# Patient Record
Sex: Female | Born: 2000 | Hispanic: Yes | Smoking: Never smoker
Health system: Southern US, Community
[De-identification: ages and names within clinical notes are randomized; demographics above are authoritative.]

## PROBLEM LIST (undated history)

## (undated) DIAGNOSIS — E8881 Metabolic syndrome: Secondary | ICD-10-CM

## (undated) DIAGNOSIS — J45909 Unspecified asthma, uncomplicated: Secondary | ICD-10-CM

## (undated) DIAGNOSIS — I10 Essential (primary) hypertension: Secondary | ICD-10-CM

## (undated) HISTORY — DX: Unspecified asthma, uncomplicated: J45.909

## (undated) HISTORY — PX: NO PAST SURGERIES: SHX2092

## (undated) HISTORY — DX: Metabolic syndrome: E88.81

---
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
## 2018-02-08 ENCOUNTER — Ambulatory Visit: Payer: Medicaid Other | Admitting: Allergy and Immunology

## 2018-02-09 ENCOUNTER — Ambulatory Visit (INDEPENDENT_AMBULATORY_CARE_PROVIDER_SITE_OTHER): Payer: Medicaid Other | Admitting: Allergy and Immunology

## 2018-02-09 ENCOUNTER — Encounter: Payer: Self-pay | Admitting: Allergy and Immunology

## 2018-02-09 VITALS — BP 104/70 | HR 88 | Temp 98.2°F | Resp 16

## 2018-02-09 DIAGNOSIS — J3089 Other allergic rhinitis: Secondary | ICD-10-CM | POA: Diagnosis not present

## 2018-02-09 DIAGNOSIS — J45901 Unspecified asthma with (acute) exacerbation: Secondary | ICD-10-CM

## 2018-02-09 MED ORDER — FLUTICASONE PROPIONATE 50 MCG/ACT NA SUSP: 2.0000 | Freq: Every day | NASAL | 5 refills | Status: DC | PRN

## 2018-02-09 MED ORDER — TIOTROPIUM BROMIDE MONOHYDRATE 1.25 MCG/ACT IN AERS: 2.0000 | INHALATION_SPRAY | Freq: Every day | RESPIRATORY_TRACT | 5 refills | Status: DC

## 2018-02-09 MED ORDER — ALBUTEROL SULFATE (2.5 MG/3ML) 0.083% IN NEBU: 2.5000 mg | INHALATION_SOLUTION | Freq: Four times a day (QID) | RESPIRATORY_TRACT | 1 refills | Status: DC | PRN

## 2018-02-09 NOTE — Assessment & Plan Note (Signed)
   Laboratory reform has been provided for serum IgE level and CBC with differential to assess candidacy for biologic agents.  Prednisone has been provided, 40 mg x3 days, 20 mg x1 day, 10 mg x1 day, then stop.  A prescription has been provided for Spiriva Respimat 1.25 g, 2 inhalations daily.  For now, continue Symbicort 160-4.5 g, 2 inhalations via spacer device twice daily, montelukast 10 mg daily bedtime, and albuterol every 6 hours as needed.  The patient has been asked to contact me if her symptoms persist or progress. Otherwise, she may return for follow up in 4 months.

## 2018-02-09 NOTE — Patient Instructions (Addendum)
Asthma with acute exacerbation  Laboratory reform has been provided for serum IgE level and CBC with differential to assess candidacy for biologic agents.  Prednisone has been provided, 40 mg x3 days, 20 mg x1 day, 10 mg x1 day, then stop.  A prescription has been provided for Spiriva Respimat 1.25 g, 2 inhalations daily.  For now, continue Symbicort 160-4.5 g, 2 inhalations via spacer device twice daily, montelukast 10 mg daily bedtime, and albuterol every 6 hours as needed.  The patient has been asked to contact me if her symptoms persist or progress. Otherwise, she may return for follow up in 4 months.  Allergic rhinitis  Continue appropriate allergen avoidance measures.  A prescription has been provided for fluticasone nasal spray, 1-2 sprays per nostril daily as needed.   I have also recommended nasal saline spray (i.e., Simply Saline) or nasal saline lavage (i.e., NeilMed) as needed and prior to medicated nasal sprays.  Continue azelastine nasal spray, 1-2 sprays per nostril twice daily.  For thick post nasal drainage, nasal congestion, and/or sinus pressure, add guaifenesin [phone] mg (Mucinex) plus/minus pseudoephedrine 60-120 mg  twice daily as needed with adequate hydration as discussed. Pseudoephedrine is only to be used for short-term relief of nasal/sinus congestion. Long-term use is discouraged due to potential side effects.  If allergen avoidance measures and medications fail to adequately relieve symptoms, aeroallergen immunotherapy will be considered.   Return in about 4 months (around 06/09/2018), or if symptoms worsen or fail to improve.

## 2018-02-09 NOTE — Progress Notes (Signed)
Follow-up Note  RE: Peggy [name] MRN: [id number] DOB: Aug 01, 2001 Date of Office Visit: 02/09/2018  Primary care provider: Melanie Crazier, NP Referring provider: Melanie Crazier, NP  History of present illness: Peggy [name] is a 17 y.o. female with persistent asthma and allergic rhinitis presenting today for sick visit.  She was last seen in this clinic in October 2018.  She reports that over the past month she has been "wheezing a lot", particularly at nighttime, as well as experiencing increased chest tightness and coughing.  She has been experiencing lower respiratory symptoms multiple times per day and is experiencing nocturnal awakenings due to lower respiratory symptoms 2 or 3 nights per week on average.  She also complains of nasal congestion, particularly at nighttime, which oftentimes results in mouth breathing.  She is attempting to control the nasal congestion with Vicks vapor rub.  In addition, she has been experiencing sinus pressure over the forehead.  She denies fevers, chills, and discolored mucus production.  She notes that spring pollen season is typically the worst season for her allergies and asthma.   Assessment and plan: Asthma with acute exacerbation  Laboratory reform has been provided for serum IgE level and CBC with differential to assess candidacy for biologic agents.  Prednisone has been provided, 40 mg x3 days, 20 mg x1 day, 10 mg x1 day, then stop.  A prescription has been provided for Spiriva Respimat 1.25 g, 2 inhalations daily.  For now, continue Symbicort 160-4.5 g, 2 inhalations via spacer device twice daily, montelukast 10 mg daily bedtime, and albuterol every 6 hours as needed.  The patient has been asked to contact me if her symptoms persist or progress. Otherwise, she may return for follow up in 4 months.  Allergic rhinitis  Continue appropriate allergen avoidance measures.  A prescription has been provided  for fluticasone nasal spray, 1-2 sprays per nostril daily as needed.   I have also recommended nasal saline spray (i.e., Simply Saline) or nasal saline lavage (i.e., NeilMed) as needed and prior to medicated nasal sprays.  Continue azelastine nasal spray, 1-2 sprays per nostril twice daily.  For thick post nasal drainage, nasal congestion, and/or sinus pressure, add guaifenesin [phone] mg (Mucinex) plus/minus pseudoephedrine 60-120 mg  twice daily as needed with adequate hydration as discussed. Pseudoephedrine is only to be used for short-term relief of nasal/sinus congestion. Long-term use is discouraged due to potential side effects.  If allergen avoidance measures and medications fail to adequately relieve symptoms, aeroallergen immunotherapy will be considered.   Meds ordered this encounter  Medications  . Tiotropium Bromide Monohydrate (SPIRIVA RESPIMAT) 1.25 MCG/ACT AERS    Sig: Inhale 2 puffs into the lungs daily.    Dispense:  4 g    Refill:  5  . fluticasone (FLONASE) 50 MCG/ACT nasal spray    Sig: Place 2 sprays into both nostrils daily as needed for allergies or rhinitis.    Dispense:  18.2 g    Refill:  5  . albuterol (PROVENTIL) (2.5 MG/3ML) 0.083% nebulizer solution    Sig: Take 3 mLs (2.5 mg total) by nebulization every 6 (six) hours as needed for wheezing or shortness of breath.    Dispense:  75 mL    Refill:  1    Diagnostics: Spirometry reveals an FVC of 2.73 L and an FEV1 of 2.49 L (83% predicted) with 70 mL (3%) postbronchodilator improvement.  Please see scanned spirometry results for details.    Physical examination: Blood pressure  104/70, pulse 88, temperature 98.2 F (36.8 C), temperature source Oral, resp. rate 16, SpO2 96 %.  General: Alert, interactive, in no acute distress. HEENT: TMs pearly gray, turbinates moderately edematous without discharge, post-pharynx mildly erythematous. Neck: Supple without lymphadenopathy. Lungs: Clear to auscultation  without wheezing, rhonchi or rales. CV: Normal S1, S2 without murmurs. Skin: Warm and dry, without lesions or rashes.  The following portions of the patient's history were reviewed and updated as appropriate: allergies, current medications, past family history, past medical history, past social history, past surgical history and problem list.  Allergies as of 02/09/2018   No Known Allergies     Medication List        Accurate as of 02/09/18  1:43 PM. Always use your most recent med list.          albuterol 108 (90 Base) MCG/ACT inhaler Commonly known as:  PROAIR HFA Inhale two puffs every four hours as needed for cough or wheeze.  Can use 10 minutes prior to exercise.   albuterol (2.5 MG/3ML) 0.083% nebulizer solution Commonly known as:  PROVENTIL Take 3 mLs (2.5 mg total) by nebulization every 6 (six) hours as needed for wheezing or shortness of breath.   Azelastine HCl 0.15 % Soln Place 2 sprays into the nose 2 (two) times daily as needed.   budesonide-formoterol 160-4.5 MCG/ACT inhaler Commonly known as:  SYMBICORT Inhale 2 puffs into the lungs 2 (two) times daily.   cetirizine 10 MG tablet Commonly known as:  ZYRTEC Take 1 tablet once a day   fluticasone 50 MCG/ACT nasal spray Commonly known as:  FLONASE Place 2 sprays into both nostrils daily as needed for allergies or rhinitis.   ibuprofen 600 MG tablet Commonly known as:  ADVIL,MOTRIN Reported on 06/16/2016   metFORMIN 500 MG tablet Commonly known as:  GLUCOPHAGE Take 1 tablet (500 mg total) by mouth 2 (two) times daily with a meal.   montelukast 10 MG tablet Commonly known as:  SINGULAIR Take 1 tablet (10 mg total) by mouth at bedtime.   naproxen 500 MG tablet Commonly known as:  NAPROSYN Reported on 06/16/2016   Tiotropium Bromide Monohydrate 1.25 MCG/ACT Aers Commonly known as:  SPIRIVA RESPIMAT Inhale 2 puffs into the lungs daily.       No Known Allergies  Review of systems: Review of systems  negative except as noted in HPI / PMHx or noted below: Constitutional: Negative.  HENT: Negative.   Eyes: Negative.  Respiratory: Negative.   Cardiovascular: Negative.  Gastrointestinal: Negative.  Genitourinary: Negative.  Musculoskeletal: Negative.  Neurological: Negative.  Endo/Heme/Allergies: Negative.  Cutaneous: Negative.  Past Medical History:  Diagnosis Date  . Asthma     Family History  Problem Relation Age of Onset  . Hypertension Mother   . Hypertension Maternal Grandmother   . Allergic rhinitis Neg Hx   . Angioedema Neg Hx   . Asthma Neg Hx   . Eczema Neg Hx   . Immunodeficiency Neg Hx   . Urticaria Neg Hx     Social History   Socioeconomic History  . Marital status: Single    Spouse name: Not on file  . Number of children: Not on file  . Years of education: Not on file  . Highest education level: Not on file  Social Needs  . Financial resource strain: Not on file  . Food insecurity - worry: Not on file  . Food insecurity - inability: Not on file  . Transportation needs - medical: Not  on file  . Transportation needs - non-medical: Not on file  Occupational History  . Not on file  Tobacco Use  . Smoking status: Never Smoker  . Smokeless tobacco: Never Used  Substance and Sexual Activity  . Alcohol use: No    Alcohol/week: 0.0 oz  . Drug use: No  . Sexual activity: Not on file  Other Topics Concern  . Not on file  Social History Narrative   Lives at home with mom dad, brother and sister attends [employer] is in the 11th grade.     I appreciate the opportunity to take part in Bartholome BillMaria [name]'s care. Please do not hesitate to contact me with questions.  Sincerely,   R. Jorene Guestarter Seven Dollens, MD

## 2018-02-09 NOTE — Assessment & Plan Note (Signed)
   Continue appropriate allergen avoidance measures.  A prescription has been provided for fluticasone nasal spray, 1-2 sprays per nostril daily as needed.   I have also recommended nasal saline spray (i.e., Simply Saline) or nasal saline lavage (i.e., NeilMed) as needed and prior to medicated nasal sprays.  Continue azelastine nasal spray, 1-2 sprays per nostril twice daily.  For thick post nasal drainage, nasal congestion, and/or sinus pressure, add guaifenesin [phone] mg (Mucinex) plus/minus pseudoephedrine 60-120 mg  twice daily as needed with adequate hydration as discussed. Pseudoephedrine is only to be used for short-term relief of nasal/sinus congestion. Long-term use is discouraged due to potential side effects.  If allergen avoidance measures and medications fail to adequately relieve symptoms, aeroallergen immunotherapy will be considered.

## 2018-02-12 LAB — CBC WITH DIFFERENTIAL/PLATELET
Basophils Absolute: 0 10*3/uL (ref 0.0–0.2)
Basos: 0 %
EOS (ABSOLUTE): 0 10*3/uL (ref 0.0–0.4)
Eos: 1 %
Hematocrit: 41.2 % (ref 34.0–46.6)
Hemoglobin: 13.5 g/dL (ref 11.1–15.9)
Immature Grans (Abs): 0 10*3/uL (ref 0.0–0.1)
Immature Granulocytes: 0 %
Lymphocytes Absolute: 2.1 10*3/uL (ref 0.7–3.1)
Lymphs: 36 %
MCH: 28.1 pg (ref 26.6–33.0)
MCHC: 32.8 g/dL (ref 31.5–35.7)
MCV: 86 fL (ref 79–97)
Monocytes Absolute: 0.3 10*3/uL (ref 0.1–0.9)
Monocytes: 5 %
Neutrophils Absolute: 3.4 10*3/uL (ref 1.4–7.0)
Neutrophils: 58 %
Platelets: 266 10*3/uL (ref 150–379)
RBC: 4.8 x10E6/uL (ref 3.77–5.28)
RDW: 14 % (ref 12.3–15.4)
WBC: 5.8 10*3/uL (ref 3.4–10.8)

## 2018-02-12 LAB — IGE: IgE (Immunoglobulin E), Serum: 27 IU/mL (ref 0–100)

## 2018-02-17 ENCOUNTER — Other Ambulatory Visit: Payer: Self-pay | Admitting: *Deleted

## 2018-02-17 DIAGNOSIS — J4540 Moderate persistent asthma, uncomplicated: Secondary | ICD-10-CM

## 2018-02-23 ENCOUNTER — Ambulatory Visit: Payer: Medicaid Other | Admitting: Allergy and Immunology

## 2018-03-22 ENCOUNTER — Ambulatory Visit: Payer: Medicaid Other | Admitting: Allergy and Immunology

## 2018-04-09 ENCOUNTER — Other Ambulatory Visit: Payer: Self-pay | Admitting: Allergy and Immunology

## 2018-04-09 DIAGNOSIS — J45901 Unspecified asthma with (acute) exacerbation: Secondary | ICD-10-CM

## 2018-06-14 ENCOUNTER — Ambulatory Visit: Payer: Medicaid Other | Admitting: Allergy and Immunology

## 2018-11-05 ENCOUNTER — Encounter: Payer: Self-pay | Admitting: Internal Medicine

## 2018-11-05 ENCOUNTER — Ambulatory Visit: Payer: Self-pay | Admitting: Internal Medicine

## 2018-11-05 VITALS — BP 120/82 | HR 82 | Resp 12 | Ht 59.0 in | Wt 156.0 lb

## 2018-11-05 DIAGNOSIS — J45901 Unspecified asthma with (acute) exacerbation: Secondary | ICD-10-CM

## 2018-11-05 DIAGNOSIS — Z30011 Encounter for initial prescription of contraceptive pills: Secondary | ICD-10-CM

## 2018-11-05 DIAGNOSIS — Z7251 High risk heterosexual behavior: Secondary | ICD-10-CM

## 2018-11-05 DIAGNOSIS — J3089 Other allergic rhinitis: Secondary | ICD-10-CM

## 2018-11-05 DIAGNOSIS — R300 Dysuria: Secondary | ICD-10-CM

## 2018-11-05 DIAGNOSIS — N760 Acute vaginitis: Secondary | ICD-10-CM

## 2018-11-05 LAB — POCT WET PREP WITH KOH
KOH Prep POC: NEGATIVE
RBC Wet Prep HPF POC: NEGATIVE
TRICHOMONAS UA: NEGATIVE
Yeast Wet Prep HPF POC: NEGATIVE

## 2018-11-05 LAB — POCT URINALYSIS DIPSTICK
BILIRUBIN UA: NEGATIVE
Blood, UA: NEGATIVE
GLUCOSE UA: NEGATIVE
KETONES UA: NEGATIVE
Nitrite, UA: POSITIVE
PROTEIN UA: NEGATIVE
UROBILINOGEN UA: 0.2 U/dL
pH, UA: 6.5 (ref 5.0–8.0)

## 2018-11-05 LAB — POCT URINE PREGNANCY: Preg Test, Ur: NEGATIVE

## 2018-11-05 MED ORDER — METRONIDAZOLE 500 MG PO TABS: ORAL_TABLET | ORAL | 0 refills | Status: DC

## 2018-11-05 MED ORDER — MONTELUKAST SODIUM 10 MG PO TABS: 10.0000 mg | ORAL_TABLET | Freq: Every day | ORAL | 11 refills | Status: DC

## 2018-11-05 MED ORDER — ALBUTEROL SULFATE HFA 108 (90 BASE) MCG/ACT IN AERS: INHALATION_SPRAY | RESPIRATORY_TRACT | 1 refills | Status: DC

## 2018-11-05 MED ORDER — FLUTICASONE PROPIONATE 50 MCG/ACT NA SUSP: 2.0000 | Freq: Every day | NASAL | 11 refills | Status: DC | PRN

## 2018-11-05 MED ORDER — CETIRIZINE HCL 10 MG PO TABS: ORAL_TABLET | ORAL | 11 refills | Status: DC

## 2018-11-05 MED ORDER — NORGESTIMATE-ETH ESTRADIOL 0.25-35 MG-MCG PO TABS: 1.0000 | ORAL_TABLET | Freq: Every day | ORAL | 3 refills | Status: DC

## 2018-11-05 MED ORDER — BUDESONIDE-FORMOTEROL FUMARATE 160-4.5 MCG/ACT IN AERO: 2.0000 | INHALATION_SPRAY | Freq: Two times a day (BID) | RESPIRATORY_TRACT | 11 refills | Status: DC

## 2018-11-05 NOTE — Patient Instructions (Signed)
Can google "advance directives, [place]"  And bring up form from Secretary of State. Print and fill out Or can go to "5 wishes"  Which is also in Spanish and fill out--this costs $5--perhaps easier to use. Designate a Medical Power of Attorney to speak for you if you are unable to speak for yourself when ill or injured  

## 2018-11-05 NOTE — Progress Notes (Addendum)
   Subjective:    Patient ID: Peggy [name], female    DOB: 11-16-2001, 17 y.o.   MRN: [id number]  HPI   Here to establish  1.  Had unprotected sex with a boyfriend she knows well about 1 week ago and then 3 days ago, she developed really obvious yellow discharge and odor.  She states she did have mild discharge and odor actually for past 2-3 weeks.  Fishy odor. No pelvic or vaginal pain.  No itching.  Never with an STD before. She has not had a period since the unprotected sex. Did try Azo as she is having burning on urination --states the burning is on the outside.   No other otc meds tried.  2.  [facility] Does not smoke. No family history of clotting disorder/DVT/PE.  3.  Asthma and allergies:  Not taking her meds as cannot afford.  No outpatient medications have been marked as taking for the 11/05/18 encounter (Office Visit) with Julieanne MansonMulberry, Teondra Newburg, MD.    No Known Allergies  Review of Systems     Objective:   Physical Exam NAD Lungs:  CTA CV:  RRR with normal S1 and S2, No S3, S4 or murmur.  Radial pulses normal and equal Abd:  S, NT, No HSM or mass, + BS GU:  Normal external genitalia with white vaginal discharge and underlying mild vaginal inflammation.  Cervix without lesion.  No CMT.  No uterine or adnexal mass or tenderness.      Assessment & Plan:  1.  Acute Vaginitis:  Wet prep supports BV.   Sending GC/chlamydia, RPR, HIV. Metronidazole 500 mg twice daily for 7 days.  Discussed side effects, no alcohol  2.  Dysuria with + nitrite and trace leuk.  Not clear she has a UTI.  Check urine culture.  3.  Unprotected sex:  Urine HCG negative.    4.  Birth control:  Sprintec 28 day, to start the Sunday after next period end of this month.  To use condoms with intercourse until then and for first month of use.  5.  Ast[facility] and allergies:  [facility]  CPE with pap in 3 months.

## 2018-11-06 LAB — HIV ANTIBODY (ROUTINE TESTING W REFLEX): HIV Screen 4th Generation wRfx: NONREACTIVE

## 2018-11-06 LAB — RPR QUALITATIVE: RPR: NONREACTIVE

## 2018-11-07 LAB — URINE CULTURE

## 2018-11-07 MED ORDER — SULFAMETHOXAZOLE-TRIMETHOPRIM 800-160 MG PO TABS: 1.0000 | ORAL_TABLET | Freq: Two times a day (BID) | ORAL | 0 refills | Status: DC

## 2018-11-07 NOTE — Addendum Note (Signed)
Addended by: Marcene DuosMULBERRY, Artha Chiasson M on: 11/07/2018 07:17 PM   Modules accepted: Orders

## 2018-11-09 LAB — GC/CHLAMYDIA PROBE AMP
Chlamydia trachomatis, NAA: POSITIVE — AB
Neisseria gonorrhoeae by PCR: NEGATIVE

## 2018-11-17 ENCOUNTER — Telehealth: Payer: Self-pay | Admitting: Internal Medicine

## 2018-11-17 MED ORDER — AZITHROMYCIN 500 MG PO TABS: ORAL_TABLET | ORAL | 0 refills | Status: DC

## 2018-11-17 NOTE — Telephone Encounter (Signed)
Patient called stating 20 minutes after swallow azithromycin (ZITHROMAX) 500 MG tablet threw up the pill. Patient wanted advise from Dr. Delrae AlfredMulberry on what is next. Information shared with Dr. Delrae AlfredMulberry who [facility] to check if patient can go back to pick another dose of the prescription; pharmacist stated medication can be dispense but this time patient has to pay $6.00 dollars to get it. Dr. Delrae AlfredMulberry spoke with patient and provided information on new prescription Docycycline 100 mg twice daily x 7 days. Patient agreed and stated will start new medication today.

## 2018-11-17 NOTE — Addendum Note (Signed)
Addended by: Marcene DuosMULBERRY, Oz Gammel M on: 11/17/2018 10:09 AM   Modules accepted: Orders

## 2018-11-29 ENCOUNTER — Other Ambulatory Visit: Payer: Self-pay

## 2018-11-29 ENCOUNTER — Telehealth: Payer: Self-pay | Admitting: Internal Medicine

## 2018-11-29 MED ORDER — METRONIDAZOLE 500 MG PO TABS: ORAL_TABLET | ORAL | 0 refills | Status: DC

## 2018-11-29 NOTE — Telephone Encounter (Signed)
Spoke with patient. States all symptoms are the same except for the odor has gone away. Patient states she has finishes the doxycycline and now she has been having lower abdominal cramping. Patient leaving the country and wants to know what she should since she is not feeling better.  Per Dr. Delrae AlfredMulberry have patient start back on metronidazole for 1 twice a day for 7 days. Patient needs to take medication with a fatty meal or with milk. Called in 4 more pills to PHD to give her a 7 day dose being patient still had 10 pills left from before. Patient verbalized understanding and Rx called into pharmacy.

## 2018-11-29 NOTE — Telephone Encounter (Signed)
Patient would like to know if she is suppose to see different result by end of the week.  She has not seen a change since taking the Rx.  Patient is leaving the country on Saturday and should she start the Rx over.  Rx in question is sulfamethoxazole-trimethoprim (BACTRIM DS.SEPTRA DS) 800-160 MG tablet.  Patient can be reached [phone].

## 2019-02-07 ENCOUNTER — Ambulatory Visit: Payer: Medicaid Other | Admitting: Internal Medicine

## 2019-02-07 ENCOUNTER — Encounter: Payer: Self-pay | Admitting: Internal Medicine

## 2019-02-07 VITALS — BP 122/82 | HR 84 | Resp 12 | Ht 59.0 in | Wt 146.0 lb

## 2019-02-07 DIAGNOSIS — Z0000 Encounter for general adult medical examination without abnormal findings: Secondary | ICD-10-CM | POA: Diagnosis not present

## 2019-02-07 DIAGNOSIS — Z9149 Other personal history of psychological trauma, not elsewhere classified: Secondary | ICD-10-CM

## 2019-02-07 DIAGNOSIS — Z9189 Other specified personal risk factors, not elsewhere classified: Secondary | ICD-10-CM

## 2019-02-07 DIAGNOSIS — E663 Overweight: Secondary | ICD-10-CM

## 2019-02-07 DIAGNOSIS — B360 Pityriasis versicolor: Secondary | ICD-10-CM

## 2019-02-07 DIAGNOSIS — Z8659 Personal history of other mental and behavioral disorders: Secondary | ICD-10-CM | POA: Diagnosis not present

## 2019-02-07 DIAGNOSIS — J45909 Unspecified asthma, uncomplicated: Secondary | ICD-10-CM

## 2019-02-07 DIAGNOSIS — J3089 Other allergic rhinitis: Secondary | ICD-10-CM

## 2019-02-07 MED ORDER — NORGESTIMATE-ETH ESTRADIOL 0.25-35 MG-MCG PO TABS: 1.0000 | ORAL_TABLET | Freq: Every day | ORAL | 3 refills | Status: DC

## 2019-02-07 NOTE — Patient Instructions (Signed)

## 2019-02-07 NOTE — Progress Notes (Signed)
Subjective:    Patient ID: Peggy [name], female   DOB: 16-Jan-2001, 18 y.o.   MRN: [id number]   HPI   CPE without pap  1.  Pap:  No pap previously.  Just started with heavy menstruation today.  History of HPV vaccination completed in past.  No family history of cervical cancer.  2.  Mammogram:  Never.  No biologic family with history of breast cancer.   3.  Osteoprevention:  Not much in way of dairy daily.  Not very physically active.  4.  Guaiac Cards:  Never.    5.  Colonoscopy:  Never.  No family history of colon cancer  6.  Immunizations:  Did not get influenza vaccine this year.  7.  Glucose/Cholesterol:  Has had mildly elevated glucose in the past with increased weight, but A1C never higher than 5.5% No lipids I can find in chart.  Asthma and allergies:  Not having any symptoms, so not taking any meds.  She is using albuterol in HFA or nebulized form twice weekly.  She is not sure of what meds she is to use for what.   Only filled her [facility] for 1 month and left for trip out of the country.  Did not refill since.  Current Meds  Medication Sig  . albuterol (PROAIR HFA) 108 (90 Base) MCG/ACT inhaler Inhale two puffs every four hours as needed  . budesonide-formoterol (SYMBICORT) 160-4.5 MCG/ACT inhaler Inhale 2 puffs into the lungs 2 (two) times daily.   No Known Allergies  Past Medical History:  Diagnosis Date  . Asthma     Past Surgical History:  Procedure Laterality Date  . NO PAST SURGERIES      Family History  Problem Relation Age of Onset  . Hypertension Mother   . Hypertension Maternal Grandmother   . Allergic rhinitis Neg Hx   . Angioedema Neg Hx   . Asthma Neg Hx   . Eczema Neg Hx   . Immunodeficiency Neg Hx   . Urticaria Neg Hx    Social History   Socioeconomic History  . Marital status: Single    Spouse name: Not on file  . Number of children: 0  . Years of education: 33  . Highest education level: High school graduate   Occupational History  . Occupation: student/[employer]  Social Needs  . Financial resource strain: Not on file  . Food insecurity:    Worry: Not on file    Inability: Not on file  . Transportation needs:    Medical: Not on file    Non-medical: Not on file  Tobacco Use  . Smoking status: Never Smoker  . Smokeless tobacco: Never Used  Substance and Sexual Activity  . Alcohol use: Yes    Alcohol/week: 0.0 standard drinks    Comment: occasionally  . Drug use: No  . Sexual activity: Yes    Birth control/protection: Condom  Lifestyle  . Physical activity:    Days per week: Not on file    Minutes per session: Not on file  . Stress: Not on file  Relationships  . Social connections:    Talks on phone: Not on file    Gets together: Not on file    Attends religious service: Not on file    Active member of club or organization: Not on file    Attends meetings of clubs or organizations: Not on file    Relationship status: Not on file  . Intimate partner violence:  Fear of current or ex partner: Not on file    Emotionally abused: Not on file    Physically abused: Not on file    Forced sexual activity: Not on file  Other Topics Concern  . Not on file  Social History Narrative   Lives at home with parents and 3 of her younger siblings    Review of Systems  Respiratory:       Chest tightness twice weekly when physically active.  Gastrointestinal: Positive for constipation (bloated.  Has hard stool and then blood on the tissue when passes at times.  ).  Psychiatric/Behavioral:       Shared that when she was 43 yo, her friend committed suicide in front of her in front of a locked glass door, so she could not intervene.   Last month, her cousin and young godfather were killed in an MVA.  She states she has received counseling in past and feels she is doing okay.   Has learned better coping mechanisms. Describes also purging when she was age 18-12 yo.  States she was teased by  her sisters for being overweight.  She never received counseling and has not purged for years.      Objective:   BP 122/82 (BP Location: Left Arm, Patient Position: Sitting, Cuff Size: Normal)   Pulse 84   Resp 12   Ht 4\' 11"  (1.499 m)   Wt 146 lb (66.2 kg)   LMP 02/06/2019   BMI 29.49 kg/m   Physical Exam  Constitutional: She is oriented to person, place, and time. She appears well-developed and well-nourished.  HENT:  Head: Normocephalic and atraumatic.  Right Ear: Hearing, tympanic membrane, external ear and ear canal normal.  Left Ear: Hearing, tympanic membrane, external ear and ear canal normal.  Nose: Nose normal.  Mouth/Throat: Uvula is midline, oropharynx is clear and moist and mucous membranes are normal.  Eyes: Pupils are equal, round, and reactive to light. Conjunctivae and EOM are normal.  Discs sharp bilaterally.  Neck: Normal range of motion and full passive range of motion without pain. Neck supple. No thyromegaly present.  Cardiovascular: Normal rate, regular rhythm, S1 normal and S2 normal. Exam reveals no S3, no S4 and no friction rub.  No murmur heard. Carotid, radial, femoral, DP and PT pulses normal and equal  Pulmonary/Chest: Effort normal and breath sounds normal. Right breast exhibits no inverted nipple, no mass, no nipple discharge and no skin change. Left breast exhibits no inverted nipple, no mass, no nipple discharge and no skin change.  Bilateral nipple piercings.  Abdominal: Soft. Normal appearance and bowel sounds are normal. She exhibits no mass. There is no hepatosplenomegaly. There is no abdominal tenderness. No hernia.  Umbilical piercing.  Genitourinary:    Genitourinary Comments: No examination today due to heavy menstrual flow and patient preference.   Musculoskeletal: Normal range of motion.  Lymphadenopathy:       Head (right side): No submental and no submandibular adenopathy present.       Head (left side): No submental and no  submandibular adenopathy present.    She has no cervical adenopathy.    She has no axillary adenopathy.       Right: No inguinal and no supraclavicular adenopathy present.       Left: No inguinal and no supraclavicular adenopathy present.  Neurological: She is alert and oriented to person, place, and time. She has normal strength and normal reflexes. No cranial nerve deficit or sensory deficit. Coordination  and gait normal.  Skin: Skin is warm.  Back with oval and round areas of varying size with loss of pigment.  Currently without any flaking.  Patient states she gets these more so during hotter months and they creep up her neck to hairline.  They can be on anterior chest as well.  Scattered tattoos.  Psychiatric: She has a normal mood and affect. Her speech is normal and behavior is normal. Judgment and thought content normal. Cognition and memory are normal.     Assessment & Plan   1.  CPE Return for fasting labs in 2 or 6 weeks and pelvic exam as well FLP, CMP, CBC  2.  Tinea versicolor:  Appears quiescent currently with post inflammatory hypopigmentation.  3.  Birth Control:  Refill BCPs  4.  Asthma /allergy:  To return as above to go over meds.   5.  Several traumatic experiences:  States she is doing fine now.  Will let us know if needs counseling.    6.  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

## 2019-03-01 ENCOUNTER — Ambulatory Visit: Payer: Medicaid Other | Admitting: Internal Medicine

## 2019-03-08 ENCOUNTER — Ambulatory Visit: Payer: Medicaid Other | Admitting: Internal Medicine

## 2019-04-07 ENCOUNTER — Encounter: Payer: Self-pay | Admitting: Internal Medicine

## 2019-04-07 ENCOUNTER — Other Ambulatory Visit: Payer: Self-pay

## 2019-04-07 ENCOUNTER — Ambulatory Visit: Payer: Medicaid Other | Admitting: Internal Medicine

## 2019-04-07 VITALS — BP 112/70 | HR 74 | Resp 12 | Ht 59.0 in | Wt 146.0 lb

## 2019-04-07 DIAGNOSIS — B9689 Other specified bacterial agents as the cause of diseases classified elsewhere: Secondary | ICD-10-CM

## 2019-04-07 DIAGNOSIS — E8881 Metabolic syndrome: Secondary | ICD-10-CM

## 2019-04-07 DIAGNOSIS — R350 Frequency of micturition: Secondary | ICD-10-CM

## 2019-04-07 DIAGNOSIS — N760 Acute vaginitis: Secondary | ICD-10-CM

## 2019-04-07 DIAGNOSIS — Z9149 Other personal history of psychological trauma, not elsewhere classified: Secondary | ICD-10-CM | POA: Insufficient documentation

## 2019-04-07 DIAGNOSIS — J45909 Unspecified asthma, uncomplicated: Secondary | ICD-10-CM

## 2019-04-07 DIAGNOSIS — J4540 Moderate persistent asthma, uncomplicated: Secondary | ICD-10-CM

## 2019-04-07 DIAGNOSIS — J45901 Unspecified asthma with (acute) exacerbation: Secondary | ICD-10-CM

## 2019-04-07 DIAGNOSIS — Z8659 Personal history of other mental and behavioral disorders: Secondary | ICD-10-CM | POA: Insufficient documentation

## 2019-04-07 DIAGNOSIS — J3089 Other allergic rhinitis: Secondary | ICD-10-CM

## 2019-04-07 LAB — POCT WET PREP WITH KOH
KOH Prep POC: NEGATIVE
RBC Wet Prep HPF POC: NEGATIVE
Trichomonas, UA: NEGATIVE
Yeast Wet Prep HPF POC: NEGATIVE

## 2019-04-07 LAB — POCT URINE PREGNANCY: Preg Test, Ur: NEGATIVE

## 2019-04-07 MED ORDER — ALBUTEROL SULFATE HFA 108 (90 BASE) MCG/ACT IN AERS: INHALATION_SPRAY | RESPIRATORY_TRACT | 1 refills | Status: DC

## 2019-04-07 MED ORDER — BUDESONIDE-FORMOTEROL FUMARATE 160-4.5 MCG/ACT IN AERO: 2.0000 | INHALATION_SPRAY | Freq: Two times a day (BID) | RESPIRATORY_TRACT | 11 refills | Status: DC

## 2019-04-07 MED ORDER — CEFTRIAXONE SODIUM 250 MG IJ SOLR
250.0000 mg | Freq: Once | INTRAMUSCULAR | Status: AC
  Administered 2019-04-07: 250 mg via INTRAMUSCULAR

## 2019-04-07 MED ORDER — CETIRIZINE HCL 10 MG PO TABS: ORAL_TABLET | ORAL | 11 refills | Status: DC

## 2019-04-07 MED ORDER — METRONIDAZOLE 500 MG PO TABS: ORAL_TABLET | ORAL | 0 refills | Status: DC

## 2019-04-07 MED ORDER — DOXYCYCLINE HYCLATE 100 MG PO TABS: ORAL_TABLET | ORAL | 0 refills | Status: DC

## 2019-04-07 NOTE — Progress Notes (Signed)
Subjective:    Patient ID: Peggy [name], female   DOB: [birth year], 18 y.o.   MRN: [id number]   HPI   1.  Here to finish up on pelvic/CPE from last visit in February as well as for vaginal discharge.  2.  Vaginal discharge:  Yellow green with an odor.  Sbe is not having anything but occasional pelvic cramping, but no pain.   Had intercourse with boyfriend she was previously with when she developed a chlamydial vaginitis in November of 2019 this past  Saturday or 5 days ago.  The next day she developed cramping and then noted the discharge. Her boyfriend states he had not recently been sexually active with anyone else and he was not having any symptoms.  She is having urinary urgency and frequency starting 4 days ago.  Only small amount of urine each time.   No flank pain.  No fever.    She has been utilizing her BCPs regularly since her physical in February.  3.  Insulin resistance syndrome with ovulatory dysfunction:  She states she was told she had prediabetes, but looks like the feeling was she perhaps has PCOS as well.  She currently denies having problems with periods before, though oligomenorrhea is well documented in her chart.   Taking Metformin 500 mg twice daily  4.  Asthma and allergies:  States only has problems with asthma when she is not taking her Cetirizine during spring and fall pollen seasons.   She later states she also had problems with exercise. Not very physically active currently. Is using her corticosteroid/LABA inhaler only before exercise along with SABA. Discussed the former really is to be used regularly for benefit and the SABA only when needed.  Discussed loss of effectiveness if SABA if used regularly.   Current Meds  Medication Sig   albuterol (PROAIR HFA) 108 (90 Base) MCG/ACT inhaler Inhale two puffs every four hours as needed   budesonide-formoterol (SYMBICORT) 160-4.5 MCG/ACT inhaler Inhale 2 puffs into the lungs 2 (two)  times daily.   metFORMIN (GLUCOPHAGE) 500 MG tablet Take 1 tablet (500 mg total) by mouth 2 (two) times daily with a meal.   norgestimate-ethinyl estradiol (SPRINTEC 28) 0.25-35 MG-MCG tablet Take 1 tablet by mouth daily.   No Known Allergies   Review of Systems    Objective:   BP 112/70 (BP Location: Left Arm, Patient Position: Sitting, Cuff Size: Normal)    Pulse 74    Resp 12    Ht 4\' 11"  (1.499 m)    Wt 146 lb (66.2 kg)    LMP 03/11/2019    BMI 29.49 kg/m   Physical Exam  NAD Lungs:  CTA CV:  RRR without murmur or rub.  Radial and DP pulses normal and equal Abd:  No flank tenderness.  No HSM or mass, Tender mildly without rebound or peritoneal signs in LLQ.  + BS GU:  Normal external genitalia.  Mild white/yellow discharge.   Perhaps mild underlying erythema of vaginal and cervical mucosa.  No lesions.  No CMT.  Tender without mass in left adnexa.  NT on palpation of uterus and right adnexa, also without mass.     Assessment & Plan   1.  Vaginitis/bacterial vaginosis:  GC/chlamydia, wet prep, the latter showed BV with strong whiff.   Ceftriaxone 150 mg IM Doxycycline 100 mg twice daily for 7 days. Metronidazole 500 mg twice daily for 7 days. To call if worsen. Encouraged her to rethink having  intercourse with this partner again. Encouraged her to stay at home/isolate with COVID-19  2.  Urinary frequency:  No evidence of UTI.  Likely related to #1.  3.  Asthma/allergies:  Rx for Cetirizine to [facility].   [facility] Symbicort [facility]. Encouraged her to get started on daily physical activity and use the Symbicort daily so she is not dyspneic. Albuterol to be used only as needed.  4.  Insulin resistance with history of acanthosis nigricans and oligomenorrhea: sounds like was felt to have PCOS:  Has refills of Metformin.  She will call when refill needed.

## 2019-04-09 LAB — GC/CHLAMYDIA PROBE AMP
Chlamydia trachomatis, NAA: NEGATIVE
Neisseria Gonorrhoeae by PCR: NEGATIVE

## 2019-04-13 ENCOUNTER — Ambulatory Visit: Payer: Medicaid Other | Admitting: Internal Medicine

## 2019-05-31 ENCOUNTER — Ambulatory Visit: Payer: Medicaid Other | Admitting: Internal Medicine

## 2019-07-08 ENCOUNTER — Ambulatory Visit: Payer: Self-pay | Admitting: Internal Medicine

## 2020-02-07 ENCOUNTER — Other Ambulatory Visit: Payer: Self-pay

## 2020-02-07 ENCOUNTER — Inpatient Hospital Stay (HOSPITAL_COMMUNITY): Payer: PRIVATE HEALTH INSURANCE

## 2020-02-07 ENCOUNTER — Inpatient Hospital Stay (HOSPITAL_COMMUNITY)
Admission: AD | Admit: 2020-02-07 | Discharge: 2020-02-07 | Disposition: A | Discharge status: Home / Self Care | Payer: PRIVATE HEALTH INSURANCE | Attending: Obstetrics and Gynecology | Admitting: Obstetrics and Gynecology

## 2020-02-07 ENCOUNTER — Encounter: Payer: Self-pay | Admitting: Student

## 2020-02-07 DIAGNOSIS — Z3A01 Less than 8 weeks gestation of pregnancy: Secondary | ICD-10-CM | POA: Insufficient documentation

## 2020-02-07 DIAGNOSIS — Z7984 Long term (current) use of oral hypoglycemic drugs: Secondary | ICD-10-CM | POA: Insufficient documentation

## 2020-02-07 DIAGNOSIS — O26891 Other specified pregnancy related conditions, first trimester: Secondary | ICD-10-CM

## 2020-02-07 DIAGNOSIS — E8881 Metabolic syndrome: Secondary | ICD-10-CM | POA: Diagnosis not present

## 2020-02-07 DIAGNOSIS — O200 Threatened abortion: Secondary | ICD-10-CM | POA: Insufficient documentation

## 2020-02-07 DIAGNOSIS — J45909 Unspecified asthma, uncomplicated: Secondary | ICD-10-CM | POA: Insufficient documentation

## 2020-02-07 DIAGNOSIS — Z79899 Other long term (current) drug therapy: Secondary | ICD-10-CM | POA: Insufficient documentation

## 2020-02-07 DIAGNOSIS — O209 Hemorrhage in early pregnancy, unspecified: Secondary | ICD-10-CM

## 2020-02-07 DIAGNOSIS — O3680X0 Pregnancy with inconclusive fetal viability, not applicable or unspecified: Secondary | ICD-10-CM | POA: Diagnosis not present

## 2020-02-07 DIAGNOSIS — O99511 Diseases of the respiratory system complicating pregnancy, first trimester: Secondary | ICD-10-CM | POA: Insufficient documentation

## 2020-02-07 LAB — URINALYSIS, ROUTINE W REFLEX MICROSCOPIC
Bacteria, UA: NONE SEEN
Bilirubin Urine: NEGATIVE
Glucose, UA: NEGATIVE mg/dL
Ketones, ur: NEGATIVE mg/dL
Leukocytes,Ua: NEGATIVE
Nitrite: NEGATIVE
Protein, ur: NEGATIVE mg/dL
Specific Gravity, Urine: 1.023 (ref 1.005–1.030)
pH: 5 (ref 5.0–8.0)

## 2020-02-07 LAB — ABO/RH: ABO/RH(D): O POS

## 2020-02-07 LAB — WET PREP, GENITAL
Sperm: NONE SEEN
Trich, Wet Prep: NONE SEEN
Yeast Wet Prep HPF POC: NONE SEEN

## 2020-02-07 LAB — CBC
HCT: 36.6 % (ref 36.0–46.0)
Hemoglobin: 11.5 g/dL — ABNORMAL LOW (ref 12.0–15.0)
MCH: 27.1 pg (ref 26.0–34.0)
MCHC: 31.4 g/dL (ref 30.0–36.0)
MCV: 86.1 fL (ref 80.0–100.0)
Platelets: 256 10*3/uL (ref 150–400)
RBC: 4.25 MIL/uL (ref 3.87–5.11)
RDW: 13.7 % (ref 11.5–15.5)
WBC: 4.5 10*3/uL (ref 4.0–10.5)
nRBC: 0 % (ref 0.0–0.2)

## 2020-02-07 LAB — HCG, QUANTITATIVE, PREGNANCY: hCG, Beta Chain, Quant, S: 48 m[IU]/mL — ABNORMAL HIGH (ref <5)

## 2020-02-07 LAB — POC URINE PREG, ED: Preg Test, Ur: POSITIVE — AB

## 2020-02-07 NOTE — Discharge Instructions (Signed)
Return to care   If you have heavier bleeding that soaks through more that 2 pads per hour for an hour or more  If you bleed so much that you feel like you might pass out or you do pass out  If you have significant abdominal pain that is not improved with Tylenol     Threatened Miscarriage  A threatened miscarriage occurs when a woman has vaginal bleeding during the first 20 weeks of pregnancy but the pregnancy has not ended. If you have vaginal bleeding during this time, your health care provider will do tests to make sure you are still pregnant. If the tests show that you are still pregnant and that the developing baby (fetus) inside your uterus is still growing, your condition is considered a threatened miscarriage. A threatened miscarriage does not mean your pregnancy will end, but it does increase the risk of losing your pregnancy (complete miscarriage). What are the causes? The cause of this condition is usually not known. For women who go on to have a complete miscarriage, the most common cause is an abnormal number of chromosomes in the developing baby. Chromosomes are the structures inside cells that hold all of a person's genetic material. What increases the risk? The following lifestyle factors may increase your risk of a miscarriage in early pregnancy:  Smoking.  Drinking excessive amounts of alcohol or caffeine.  Recreational drug use. The following preexisting health conditions may increase your risk of a miscarriage in early pregnancy:  Polycystic ovary syndrome.  Uterine fibroids.  Infections.  Diabetes mellitus. What are the signs or symptoms? Symptoms of this condition include:  Vaginal bleeding.  Mild abdominal pain or cramps. How is this diagnosed? If you have bleeding with or without abdominal pain before 20 weeks of pregnancy, your health care provider will do tests to check whether you are still pregnant. These will include:  Ultrasound. This test uses  sound waves to create images of the inside of your uterus. This allows your health care provider to look at your developing baby and other structures, such as your placenta.  Pelvic exam. This is an internal exam of your vagina and cervix.  Measurement of your baby's heart rate.  Laboratory tests such as blood tests, urine tests, or swabs for infection You may be diagnosed with a threatened miscarriage if:  Ultrasound testing shows that you are still pregnant.  Your baby's heart rate is strong.  A pelvic exam shows that the opening between your uterus and your vagina (cervix) is closed.  Blood tests confirm that you are still pregnant. How is this treated? No treatments have been shown to prevent a threatened miscarriage from going on to a complete miscarriage. However, the right home care is important. Follow these instructions at home:  Get plenty of rest.  Do not have sex or use tampons if you have vaginal bleeding.  Do not douche.  Do not smoke or use recreational drugs.  Do not drink alcohol.  Avoid caffeine.  Keep all follow-up prenatal visits as told by your health care provider. This is important. Contact a health care provider if:  You have light vaginal bleeding or spotting while pregnant.  You have abdominal pain or cramping.  You have a fever. Get help right away if:  You have heavy vaginal bleeding.  You have blood clots coming from your vagina.  You pass tissue from your vagina.  You leak fluid, or you have a gush of fluid from your vagina.  You have severe low back pain or abdominal cramps.  You have fever, chills, and severe abdominal pain. Summary  A threatened miscarriage occurs when a woman has vaginal bleeding during the first 20 weeks of pregnancy but the pregnancy has not ended.  The cause of a threatened miscarriage is usually not known.  Symptoms of this condition may include vaginal bleeding and mild abdominal pain or cramps.  No  treatments have been shown to prevent a threatened miscarriage from going on to a complete miscarriage.  Keep all follow-up prenatal visits as told by your health care provider. This is important. This information is not intended to replace advice given to you by your health care provider. Make sure you discuss any questions you have with your health care provider. Document Revised: 01/14/2018 Document Reviewed: 03/06/2017 Elsevier Patient Education  2020 [employer].

## 2020-02-07 NOTE — MAU Note (Signed)
Pt presents to MAU from ED due to vaginal bleeding that started on Saturday and abdominal cramping that started Monday, today cramping is much worse. LMP-01/03/2020

## 2020-02-07 NOTE — ED Provider Notes (Signed)
Patient reports [redacted] weeks pregnant, spotting/passing clots x 3 days. Has used 2 pads today, not soaking through. Reports abdominal cramping, nausea/vomiting. G1P0, has not seen OB.  Vitals WNL.   Call to Erin, MAU APP, will send to MAU for further evaluation.  MSE was initiated and I personally evaluated the patient and placed orders (if any) at  8:49 AM on February 07, 2020.  The patient appears stable so that the remainder of the MSE may be completed by another provider.   Jeannie Fend, PA-C 02/07/20 0854    Virgina Norfolk, DO 02/07/20 [phone]

## 2020-02-07 NOTE — MAU Note (Signed)
Sent up from ED, early preg w/cramping and bleeding.

## 2020-02-07 NOTE — MAU Provider Note (Signed)
Chief Complaint: Vaginal Bleeding and Abdominal Pain   First Provider Initiated Contact with Patient 02/07/20 0949     SUBJECTIVE HPI: Peggy [name] is a 19 y.o. G1P0 at [redacted]w[redacted]d by LMP who presents to [facility] reporting vaginal bleeding & abdominal cramping. Reports bleeding since Saturday. Flow has varied from heavy bleeding to spotting.Not saturating pads today but on Saturday states she had to replace her pad every 20 minutes. Passed some clots on Saturday but none since. Lower abdominal cramping started yesterday and has worsened today. [facility] but hasn't been evaluated otherwise.   Location: abdomen Quality: cramping Severity: 8/10 on pain scale Duration: 2 days Timing: intermittent Modifying factors: none Associated signs and symptoms: vaginal bleeding  Past Medical History:  Diagnosis Date  . Asthma   . Insulin resistance    and oligomenorrhea   OB History  Gravida Para Term Preterm AB Living  1            SAB TAB Ectopic Multiple Live Births               # Outcome Date GA Lbr Len/2nd Weight Sex Delivery Anes PTL Lv  1 Current            Past Surgical History:  Procedure Laterality Date  . NO PAST SURGERIES     Social History   Socioeconomic History  . Marital status: Single    Spouse name: Not on file  . Number of children: 0  . Years of education: 74  . Highest education level: High school graduate  Occupational History  . Occupation: student/[employer]  Tobacco Use  . Smoking status: Never Smoker  . Smokeless tobacco: Never Used  Substance and Sexual Activity  . Alcohol use: Not Currently    Alcohol/week: 0.0 standard drinks  . Drug use: No  . Sexual activity: Yes    Birth control/protection: None  Other Topics Concern  . Not on file  Social History Narrative   Lives at home with parents and 3 of her younger siblings   Social Determinants of Health   Financial Resource  Strain:   . Difficulty of Paying Living Expenses: Not on file  Food Insecurity:   . Worried About Programme researcher, broadcasting/film/video in the Last Year: Not on file  . Ran Out of Food in the Last Year: Not on file  Transportation Needs:   . Lack of Transportation (Medical): Not on file  . Lack of Transportation (Non-Medical): Not on file  Physical Activity:   . Days of Exercise per Week: Not on file  . Minutes of Exercise per Session: Not on file  Stress:   . Feeling of Stress : Not on file  Social Connections:   . Frequency of Communication with Friends and Family: Not on file  . Frequency of Social Gatherings with Friends and Family: Not on file  . Attends Religious Services: Not on file  . Active Member of Clubs or Organizations: Not on file  . Attends Banker Meetings: Not on file  . Marital Status: Not on file  Intimate Partner Violence:   . Fear of Current or Ex-Partner: Not on file  . Emotionally Abused: Not on file  . Physically Abused: Not on file  . Sexually Abused: Not on file   Family History  Problem Relation Age of Onset  . Hypertension Mother   . Hypertension Maternal Grandmother   . Allergic rhinitis Neg Hx   .  Angioedema Neg Hx   . Asthma Neg Hx   . Eczema Neg Hx   . Immunodeficiency Neg Hx   . Urticaria Neg Hx    No current facility-administered medications on file prior to encounter.   Current Outpatient Medications on File Prior to Encounter  Medication Sig Dispense Refill  . albuterol (PROAIR HFA) 108 (90 Base) MCG/ACT inhaler Inhale two puffs every four hours as needed 1 Inhaler 1  . budesonide-formoterol (SYMBICORT) 160-4.5 MCG/ACT inhaler Inhale 2 puffs into the lungs 2 (two) times daily. 1 Inhaler 11  . cetirizine (ZYRTEC) 10 MG tablet Take 1 tablet once a day 30 tablet 11  . doxycycline (VIBRA-TABS) 100 MG tablet 1 tab by mouth twice daily before a meal 14 tablet 0  . metFORMIN (GLUCOPHAGE) 500 MG tablet Take 1 tablet (500 mg total) by mouth 2 (two)  times daily with a meal. 60 tablet 11  . metroNIDAZOLE (FLAGYL) 500 MG tablet 1 tab by mouth twice daily after meals 14 tablet 0  . norgestimate-ethinyl estradiol (SPRINTEC 28) 0.25-35 MG-MCG tablet Take 1 tablet by mouth daily. 1 Package 3   No Known Allergies  I have reviewed patient's Past Medical Hx, Surgical Hx, Family Hx, Social Hx, medications and allergies.   Review of Systems  Constitutional: Negative.   Gastrointestinal: Positive for abdominal pain. Negative for constipation, diarrhea, nausea and vomiting.  Genitourinary: Positive for vaginal bleeding.    OBJECTIVE Patient Vitals for the past 24 hrs:  BP Temp Pulse Resp SpO2 Weight  02/07/20 0941 (!) 116/52 98.4 F (36.9 C) 74 18 100 % --  02/07/20 0934 -- -- -- -- -- 73.5 kg  02/07/20 0829 122/83 98.1 F (36.7 C) 98 18 97 % --   Constitutional: Well-developed, well-nourished female in no acute distress.  Cardiovascular: normal rate & rhythm, no murmur Respiratory: normal rate and effort. Lung sounds clear throughout GI: Abd soft, non-tender, Pos BS x 4. No guarding or rebound tenderness MS: Extremities nontender, no edema, normal ROM Neurologic: Alert and oriented x 4.  GU:     SPECULUM EXAM: NEFG, moderate amount of dark red blood. Cervix pink/smooth/not friable.   BIMANUAL: No CMT. cervix closed; uterus normal size, no adnexal tenderness or masses.    LAB RESULTS Results for orders placed or performed during the hospital encounter of 02/07/20 (from the past 24 hour(s))  POC Urine Pregnancy, ED (not at [facility])     Status: Abnormal   Collection Time: 02/07/20  8:37 AM  Result Value Ref Range   Preg Test, Ur POSITIVE (A) NEGATIVE  Urinalysis, Routine w reflex microscopic     Status: Abnormal   Collection Time: 02/07/20  9:34 AM  Result Value Ref Range   Color, Urine YELLOW YELLOW   APPearance CLEAR CLEAR   Specific Gravity, Urine 1.023 1.005 - 1.030   pH 5.0 5.0 - 8.0   Glucose, UA NEGATIVE NEGATIVE mg/dL   Hgb  urine dipstick LARGE (A) NEGATIVE   Bilirubin Urine NEGATIVE NEGATIVE   Ketones, ur NEGATIVE NEGATIVE mg/dL   Protein, ur NEGATIVE NEGATIVE mg/dL   Nitrite NEGATIVE NEGATIVE   Leukocytes,Ua NEGATIVE NEGATIVE   RBC / HPF 0-5 0 - 5 RBC/hpf   WBC, UA 0-5 0 - 5 WBC/hpf   Bacteria, UA NONE SEEN NONE SEEN   Squamous Epithelial / LPF 0-5 0 - 5   Mucus PRESENT   CBC     Status: Abnormal   Collection Time: 02/07/20 10:05 AM  Result Value Ref Range  WBC 4.5 4.0 - 10.5 K/uL   RBC 4.25 3.87 - 5.11 MIL/uL   Hemoglobin 11.5 (L) 12.0 - 15.0 g/dL   HCT 36.6 36.0 - 46.0 %   MCV 86.1 80.0 - 100.0 fL   MCH 27.1 26.0 - 34.0 pg   MCHC 31.4 30.0 - 36.0 g/dL   RDW 13.7 11.5 - 15.5 %   Platelets 256 150 - 400 K/uL   nRBC 0.0 0.0 - 0.2 %  ABO/Rh     Status: None   Collection Time: 02/07/20 10:05 AM  Result Value Ref Range   ABO/RH(D) O POS    No rh immune globuloin      NOT A RH IMMUNE GLOBULIN CANDIDATE, PT RH POSITIVE Performed at [facility] [address] [place], [place] [address]   hCG, quantitative, pregnancy     Status: Abnormal   Collection Time: 02/07/20 10:05 AM  Result Value Ref Range   hCG, Beta Chain, Quant, S 48 (H) <5 mIU/mL  Wet prep, genital     Status: Abnormal   Collection Time: 02/07/20 10:14 AM  Result Value Ref Range   Yeast Wet Prep HPF POC NONE SEEN NONE SEEN   Trich, Wet Prep NONE SEEN NONE SEEN   Clue Cells Wet Prep HPF POC PRESENT (A) NONE SEEN   WBC, Wet Prep HPF POC FEW (A) NONE SEEN   Sperm NONE SEEN     IMAGING US OB LESS THAN 14 WEEKS WITH OB TRANSVAGINAL  Result Date: 02/07/2020 CLINICAL DATA:  Onset vaginal bleeding and abdominal pain 02/04/2020. Quantitative HCG of 48. EXAM: OBSTETRIC <14 WK [place] AND TRANSVAGINAL OB US TECHNIQUE: Both transabdominal and transvaginal ultrasound examinations were performed for complete evaluation of the gestation as well as the maternal uterus, adnexal regions, and pelvic cul-de-sac. Transvaginal technique was  performed to assess early pregnancy. COMPARISON:  None. FINDINGS: Intrauterine gestational sac: Not visualized. Yolk sac:  Not visualized. Embryo:  Not visualized. Cardiac Activity: Not applicable. Subchorionic hemorrhage:  Not applicable. Maternal uterus/adnexae: Appear normal. There is a small to moderate volume of free pelvic fluid. IMPRESSION: Negative for ultrasound evidence of pregnancy. This could be due to early gestation given quantitative HCG of 9 or completed abortion. Recommend follow-up quantitative HCG. Small to moderate volume of free pelvic fluid. Cause for the fluid is not identified. Electronically Signed   By: Inge Rise M.D.   On: 02/07/2020 11:39    MAU COURSE Orders Placed This Encounter  Procedures  . Wet prep, genital  . US OB LESS THAN 14 WEEKS WITH OB TRANSVAGINAL  . Urinalysis, Routine w reflex microscopic  . CBC  . hCG, quantitative, pregnancy  . POC Urine Pregnancy, ED (not at [facility])  . ABO/Rh  . Discharge patient   No orders of the defined types were placed in this encounter.   MDM +UPT UA, wet prep, GC/chlamydia, CBC, ABO/Rh, quant hCG, and [place] today to rule out ectopic pregnancy which can be life threatening.   RH positive  HCG 48. Ultrasound shows no IUP or adnexal mass.  Patient had her first positive HPT on 1/27 & pregnancy confirmed at [facility] on 2/1. This is most likely a miscarriage but can't exclude possibility of ectopic pregnancy yet. Will bring to office on Thursday for a stat HCG.   ASSESSMENT 1. Pregnancy of unknown anatomic location   2. Vaginal bleeding in pregnancy, first trimester   3. Abdominal pain during pregnancy in first trimester   4. Threatened miscarriage  PLAN Discharge home in stable condition. SAB vs ectopic precautions GC/CT pending Scheduled for stat HCG on Thursday at [facility]  Allergies as of 02/07/2020   No Known Allergies     Medication List    STOP taking these medications   albuterol 108 (90 Base)  MCG/ACT inhaler Commonly known as: ProAir HFA   budesonide-formoterol 160-4.5 MCG/ACT inhaler Commonly known as: SYMBICORT   cetirizine 10 MG tablet Commonly known as: ZYRTEC   doxycycline 100 MG tablet Commonly known as: VIBRA-TABS   metFORMIN 500 MG tablet Commonly known as: Glucophage   metroNIDAZOLE 500 MG tablet Commonly known as: FLAGYL   norgestimate-ethinyl estradiol 0.25-35 MG-MCG tablet Commonly known as: Sprintec 28        Judeth Horn, NP 02/07/2020  11:58 AM

## 2020-02-07 NOTE — ED Notes (Signed)
Preg (+). Pt given urine back incase needed at MAU.

## 2020-02-08 LAB — GC/CHLAMYDIA PROBE AMP (~~LOC~~) NOT AT ARMC
Chlamydia: NEGATIVE
Comment: NEGATIVE
Comment: NORMAL
Neisseria Gonorrhea: NEGATIVE

## 2020-02-09 ENCOUNTER — Ambulatory Visit: Payer: PRIVATE HEALTH INSURANCE

## 2020-02-10 ENCOUNTER — Ambulatory Visit: Payer: PRIVATE HEALTH INSURANCE

## 2020-12-02 NOTE — Progress Notes (Signed)
Follow Up Note  RE: Peggy [name] MRN: [id number] DOB: 2001-07-05 Date of Office Visit: 12/03/2020  Referring provider: No ref. provider found Primary care provider: Patient, No Pcp Per  Chief Complaint: Asthma  History of Present Illness: I had the pleasure of seeing Peggy [name] for a follow up visit at the [facility] on 12/03/2020. She is a 19 y.o. female, who is being followed for asthma allergic rhinitis. Her previous allergy office visit was on 02/09/2018 with Dr. Nunzio Cobbs. Today is a regular follow up visit and asthma not controlled.   Asthma Unable to follow up as she lost her insurance but now has insurance again. Patient has been having issues with chest tightness, increased breathing rate, shortness of breath. Limited physical activity with walking.  Sometimes hot showers cause her breathing to flare. Has wheezing and chest tightness at night.  This has been going for the past 4-5 months.   Currently on albuterol 3-4 puffs every 2 hours and Symbicort only using if needed.  Last albuterol use was around 9:15AM 3 puffs.  Denies any ER/urgent care visits or prednisone use since the last visit.  Not on montelukast anymore.   Allergic rhinitis 2017 skin testing was positive to cat, cockroach, mold Dust triggers sneezing, coughing, itchy nose.  Currently taking zyrtec 10mg  daily.  Not using any nasal sprays.   Assessment and Plan: Peggy [name] is a 19 y.o. female with: Asthma, not well controlled Patient did not follow up due to loss of insurance. The past 4-5 months worsening asthma symptoms using albuterol throughout the day but not using maintenance Symbicort and not on Singulair anymore. . Today's spirometry consistent with normal pattern with no improvement in FEV1 post bronchodilator treatment however clinically feeling improved. Used albuterol 1.5 hours before visit. . Daily controller medication(s): start  Symbicort 36 2 puffs twice a day with spacer and rinse mouth afterwards. o Spacer given and demonstrated proper use with inhaler. Patient understood technique and all questions/concerned were addressed.  Start Singulair (montelukast) 10mg  daily at night. Cautioned that in some children/adults can experience behavioral changes including hyperactivity, agitation, depression, sleep disturbances and suicidal ideations. These side effects are rare, but if you notice them you should notify me and discontinue Singulair (montelukast). . May use albuterol rescue inhaler 2 puffs every 4 to 6 hours as needed for shortness of breath, chest tightness, coughing, and wheezing. May use albuterol rescue inhaler 2 puffs 5 to 15 minutes prior to strenuous physical activities. Monitor frequency of use.  . Get spirometry at next visit.  . Get bloodwork - CBC diff to rule out anemia contributing to her shortness of breath. No recent bloodwork.   Allergic rhinitis Past history - 2017 skin testing was positive to cat, cockroach, mold. Interim history - increased nasal symptoms. Only taking zyrtec daily.  May use over the counter antihistamines such as Zyrtec (cetirizine), Claritin (loratadine), Allegra (fexofenadine), or Xyzal (levocetirizine) daily as needed.  May use Flonase (fluticasone) nasal spray 1 spray per nostril twice a day as needed for nasal congestion.   Start Singulair 10mg  daily as above.  Continue environmental control measures.   Return in about 4 weeks (around 12/31/2020).  Meds ordered this encounter  Medications  . budesonide-formoterol (SYMBICORT) 160-4.5 MCG/ACT inhaler    Sig: Inhale 2 puffs into the lungs in the morning and at bedtime. with spacer and rinse mouth afterwards.    Dispense:  1 each    Refill:  5  .  montelukast (SINGULAIR) 10 MG tablet    Sig: Take 1 tablet (10 mg total) by mouth at bedtime.    Dispense:  30 tablet    Refill:  5  . fluticasone (FLONASE) 50 MCG/ACT  nasal spray    Sig: Place 1 spray into both nostrils 2 (two) times daily as needed for allergies or rhinitis.    Dispense:  16 g    Refill:  5    Lab Orders     CBC with Differential/Platelet  Diagnostics: Spirometry:  Patient used her own albuterol 3 puffs about 1.5 hours before OV. Tracings reviewed. Her effort: Good reproducible efforts. FVC: 3.51L FEV1: 2.86L, 93% predicted FEV1/FVC ratio: 81% Interpretation: Spirometry consistent with normal pattern with no improvement in FEV1 post bronchodilator treatment however clinically feeling improved.  Please see scanned spirometry results for details.  Medication List:  Current Outpatient Medications  Medication Sig Dispense Refill  . PROAIR HFA 108 (90 Base) MCG/ACT inhaler SMARTSIG:1-2 Puff(s) Via Inhaler Every 6 Hours PRN    . budesonide-formoterol (SYMBICORT) 160-4.5 MCG/ACT inhaler Inhale 2 puffs into the lungs in the morning and at bedtime. with spacer and rinse mouth afterwards. 1 each 5  . fluticasone (FLONASE) 50 MCG/ACT nasal spray Place 1 spray into both nostrils 2 (two) times daily as needed for allergies or rhinitis. 16 g 5  . montelukast (SINGULAIR) 10 MG tablet Take 1 tablet (10 mg total) by mouth at bedtime. 30 tablet 5   No current facility-administered medications for this visit.   Allergies: No Known Allergies I reviewed her past medical history, social history, family history, and environmental history and no significant changes have been reported from her previous visit.  Review of Systems  Constitutional: Positive for appetite change and chills. Negative for fever and unexpected weight change.  HENT: Positive for congestion and sneezing. Negative for rhinorrhea.   Eyes: Negative for itching.  Respiratory: Positive for cough, chest tightness, shortness of breath and wheezing.   Gastrointestinal: Negative for abdominal pain.  Skin: Negative for rash.  Allergic/Immunologic: Positive for environmental allergies.   Neurological: Negative for headaches.   Objective: Pulse 87   Temp 98.4 F (36.9 C) (Temporal)   Resp 18   Ht 5\' 1"  (1.549 m)   Wt 163 lb (73.9 kg)   LMP 01/03/2020 (Exact Date)   SpO2 99%   BMI 30.80 kg/m  Body mass index is 30.8 kg/m. Physical Exam Vitals and nursing note reviewed.  Constitutional:      Appearance: Normal appearance. She is well-developed.  HENT:     Head: Normocephalic and atraumatic.     Right Ear: External ear normal.     Left Ear: External ear normal.     Nose: Nose normal.     Mouth/Throat:     Mouth: Mucous membranes are moist.     Pharynx: Oropharynx is clear.  Eyes:     Conjunctiva/sclera: Conjunctivae normal.  Cardiovascular:     Rate and Rhythm: Normal rate and regular rhythm.     Heart sounds: Normal heart sounds. No murmur heard.   Pulmonary:     Effort: Pulmonary effort is normal.     Breath sounds: Normal breath sounds. No wheezing, rhonchi or rales.  Musculoskeletal:     Cervical back: Neck supple.  Skin:    General: Skin is warm.     Findings: No rash.  Neurological:     Mental Status: She is alert and oriented to person, place, and time.  Psychiatric:  Behavior: Behavior normal.    Previous notes and tests were reviewed. The plan was reviewed with the patient/family, and all questions/concerned were addressed.  It was my pleasure to see Jeffery Gammell today and participate in her care. Please feel free to contact me with any questions or concerns.  Sincerely,  Wyline Mood, DO Allergy & Immunology  [facility] office: [phone] [facility] office: [phone]

## 2020-12-03 ENCOUNTER — Ambulatory Visit (INDEPENDENT_AMBULATORY_CARE_PROVIDER_SITE_OTHER): Payer: Medicaid Other | Admitting: Allergy

## 2020-12-03 ENCOUNTER — Encounter: Payer: Self-pay | Admitting: Allergy

## 2020-12-03 ENCOUNTER — Other Ambulatory Visit: Payer: Self-pay

## 2020-12-03 VITALS — HR 87 | Temp 98.4°F | Resp 18 | Ht 61.0 in | Wt 163.0 lb

## 2020-12-03 DIAGNOSIS — J3089 Other allergic rhinitis: Secondary | ICD-10-CM

## 2020-12-03 DIAGNOSIS — J4541 Moderate persistent asthma with (acute) exacerbation: Secondary | ICD-10-CM

## 2020-12-03 DIAGNOSIS — J45909 Unspecified asthma, uncomplicated: Secondary | ICD-10-CM

## 2020-12-03 MED ORDER — BUDESONIDE-FORMOTEROL FUMARATE 160-4.5 MCG/ACT IN AERO: 2.0000 | INHALATION_SPRAY | Freq: Two times a day (BID) | RESPIRATORY_TRACT | 5 refills | Status: DC

## 2020-12-03 MED ORDER — FLUTICASONE PROPIONATE 50 MCG/ACT NA SUSP: 1.0000 | Freq: Two times a day (BID) | NASAL | 5 refills | Status: DC | PRN

## 2020-12-03 MED ORDER — MONTELUKAST SODIUM 10 MG PO TABS: 10.0000 mg | ORAL_TABLET | Freq: Every day | ORAL | 5 refills | Status: DC

## 2020-12-03 NOTE — Assessment & Plan Note (Signed)
Patient did not follow up due to loss of insurance. The past 4-5 months worsening asthma symptoms using albuterol throughout the day but not using maintenance Symbicort and not on Singulair anymore. . Today's spirometry consistent with normal pattern with no improvement in FEV1 post bronchodilator treatment however clinically feeling improved. Used albuterol 1.5 hours before visit. . Daily controller medication(s): start Symbicort 2 puffs twice a day with spacer and rinse mouth afterwards. o Spacer given and demonstrated proper use with inhaler. Patient understood technique and all questions/concerned were addressed.  Start Singulair (montelukast) 10mg  daily at night. Cautioned that in some children/adults can experience behavioral changes including hyperactivity, agitation, depression, sleep disturbances and suicidal ideations. These side effects are rare, but if you notice them you should notify me and discontinue Singulair (montelukast). . May use albuterol rescue inhaler 2 puffs every 4 to 6 hours as needed for shortness of breath, chest tightness, coughing, and wheezing. May use albuterol rescue inhaler 2 puffs 5 to 15 minutes prior to strenuous physical activities. Monitor frequency of use.  . Get spirometry at next visit.  . Get bloodwork - CBC diff to rule out anemia contributing to her shortness of breath. No recent bloodwork.

## 2020-12-03 NOTE — Patient Instructions (Addendum)
Asthma: . Daily controller medication(s): start Symbicort 2 puffs twice a day with spacer and rinse mouth afterwards. o Spacer given and demonstrated proper use with inhaler. Patient understood technique and all questions/concerned were addressed.  Start Singulair (montelukast) 10mg  daily at night. Cautioned that in some children/adults can experience behavioral changes including hyperactivity, agitation, depression, sleep disturbances and suicidal ideations. These side effects are rare, but if you notice them you should notify me and discontinue Singulair (montelukast). . May use albuterol rescue inhaler 2 puffs every 4 to 6 hours as needed for shortness of breath, chest tightness, coughing, and wheezing. May use albuterol rescue inhaler 2 puffs 5 to 15 minutes prior to strenuous physical activities. Monitor frequency of use.  . Asthma control goals:  o Full participation in all desired activities (may need albuterol before activity) o Albuterol use two times or less a week on average (not counting use with activity) o Cough interfering with sleep two times or less a month o Oral steroids no more than once a year o No hospitalizations  . Get bloodwork:  o We are ordering labs, so please allow 1-2 weeks for the results to come back. o With the newly implemented Cures Act, the labs might be visible to you at the same time that they become visible to me. However, I will not address the results until all of the results are back, so please be patient.   Allergic rhinitis:   May use over the counter antihistamines such as Zyrtec (cetirizine), Claritin (loratadine), Allegra (fexofenadine), or Xyzal (levocetirizine) daily as needed.  May use Flonase (fluticasone) nasal spray 1 spray per nostril twice a day as needed for nasal congestion.   Start Singulair 10mg  daily as above.  Follow up in 1 month or sooner if needed to check on your asthma.

## 2020-12-03 NOTE — Assessment & Plan Note (Signed)
Past history - 2017 skin testing was positive to cat, cockroach, mold. Interim history - increased nasal symptoms. Only taking zyrtec daily.  May use over the counter antihistamines such as Zyrtec (cetirizine), Claritin (loratadine), Allegra (fexofenadine), or Xyzal (levocetirizine) daily as needed.  May use Flonase (fluticasone) nasal spray 1 spray per nostril twice a day as needed for nasal congestion.   Start Singulair 10mg  daily as above.  Continue environmental control measures.

## 2020-12-04 LAB — CBC WITH DIFFERENTIAL/PLATELET
Basophils Absolute: 0 10*3/uL (ref 0.0–0.2)
Basos: 1 %
EOS (ABSOLUTE): 0.1 10*3/uL (ref 0.0–0.4)
Eos: 1 %
Hematocrit: 39.8 % (ref 34.0–46.6)
Hemoglobin: 13.2 g/dL (ref 11.1–15.9)
Immature Grans (Abs): 0 10*3/uL (ref 0.0–0.1)
Immature Granulocytes: 0 %
Lymphocytes Absolute: 2.1 10*3/uL (ref 0.7–3.1)
Lymphs: 40 %
MCH: 27.9 pg (ref 26.6–33.0)
MCHC: 33.2 g/dL (ref 31.5–35.7)
MCV: 84 fL (ref 79–97)
Monocytes Absolute: 0.4 10*3/uL (ref 0.1–0.9)
Monocytes: 8 %
Neutrophils Absolute: 2.6 10*3/uL (ref 1.4–7.0)
Neutrophils: 50 %
Platelets: 302 10*3/uL (ref 150–450)
RBC: 4.73 x10E6/uL (ref 3.77–5.28)
RDW: 13.4 % (ref 11.7–15.4)
WBC: 5.2 10*3/uL (ref 3.4–10.8)

## 2021-01-08 NOTE — Progress Notes (Signed)
Follow Up Note  RE: Peggy [name] MRN: [id number] DOB: 24-Mar-2001 Date of Office Visit: 01/09/2021  Referring provider: No ref. 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 States that running and bath time is the cause of her need to use her rescue inhaler. She also sleeps with at night she has issues with wheezing. Has had to use inhaler at night. 3-4 times a week. )  History of Present Illness: I had the pleasure of seeing Peggy [name] for a follow up visit at the [facility] [place] [facility] on 01/09/2021. She is a 20 y.o. female, who is being followed for asthma and allergic rhinitis. Her previous allergy office visit was on 12/03/2020 with Dr. Selena Batten. Today is a regular follow up visit.  Asthma ACT score 13 Currently on Symbicort 2 puffs in the morning 1 puff at night and Singulair at night.   Using albuterol 2 puffs about once a shift with good benefit. This is much less than before.  She still wheezing 2-3 times a week mainly at night when it's cold indoors.  Patient had Covid-19 in June 2021 and October 2021.   Patient apparently had normal EKG.  Noted headaches as well and was concerned if asthma was contributing to this.   Allergic rhinitis Having itchy nose/eyes - this may have been due to running out of zyrtec.  Taking zyrtec daily and Singulair daily. Using Flonase about 4 times per week. No nosebleeds.    Component     Latest Ref Rng & Units 12/03/2020  WBC     3.4 - 10.8 x10E3/uL 5.2  RBC     3.77 - 5.28 x10E6/uL 4.73  Hemoglobin     11.1 - 15.9 g/dL 40.9  HCT     81.1 - 91.4 % 39.8  MCV     79 - 97 fL 84  MCH     26.6 - 33.0 pg 27.9  MCHC     31.5 - 35.7 g/dL 78.2  RDW     95.6 - 21.3 % 13.4  Platelets     150 - 450 x10E3/uL 302  Neutrophils     Not Estab. % 50  Lymphs     Not Estab. % 40   Monocytes     Not Estab. % 8  Eos     Not Estab. % 1  Basos     Not Estab. % 1  NEUT#     1.4 - 7.0 x10E3/uL 2.6  Lymphocyte #     0.7 - 3.1 x10E3/uL 2.1  Monocytes Absolute     0.1 - 0.9 x10E3/uL 0.4  EOS (ABSOLUTE)     0.0 - 0.4 x10E3/uL 0.1  Basophils Absolute     0.0 - 0.2 x10E3/uL 0.0  Immature Granulocytes     Not Estab. % 0  Immature Grans (Abs)     0.0 - 0.1 x10E3/uL 0.0    Assessment and Plan: Peggy [name] is a 20 y.o. female with: Asthma, not well controlled Past history - had COVID-19 twice in 2021. Normal EKG per patient report.  Interim history - CBC normal. Improved with below regimen but still has wheezing and using albuterol at work with good benefit.   Today's spirometry was unremarkable.  . Daily controller medication(s): start Symbicort 2 puffs twice a day with spacer and rinse mouth afterwards -  patient was taking only 1 puff in pm and 2 puffs in am. o If breathing is not better in 2 weeks with increased Symbicort, then START Spiriva 1.11mcg 2 puffs daily. Sample given. Demonstrated proper use.  - If this works, then let us know and will send in a prescription for this.  Continue Singulair (montelukast) 10mg  daily at night. . May use albuterol rescue inhaler 2 puffs every 4 to 6 hours as needed for shortness of breath, chest tightness, coughing, and wheezing. May use albuterol rescue inhaler 2 puffs 5 to 15 minutes prior to strenuous physical activities. Monitor frequency of use.  . Repeat spirometry at next visit.  Allergic rhinitis Past history - 2017 skin testing was positive to cat, cockroach, mold. Interim history - missed zyrtec and has some increased symptoms.   May use over the counter antihistamines such as Zyrtec (cetirizine), Claritin (loratadine), Allegra (fexofenadine), or Xyzal (levocetirizine) daily as needed.  May use Flonase (fluticasone) nasal spray 1 spray per nostril twice a day as needed for nasal congestion.   Continue  Singulair 10mg  daily as above.  Re-test for environmental allergies at next visit.   Generalized headache  Follow up with PCP regarding headaches.   Return in about 2 months (around 03/09/2021) for Skin testing.  Meds ordered this encounter  Medications  . budesonide-formoterol (SYMBICORT) 160-4.5 MCG/ACT inhaler    Sig: Inhale 2 puffs into the lungs in the morning and at bedtime. with spacer and rinse mouth afterwards.    Dispense:  1 each    Refill:  5  . albuterol (VENTOLIN HFA) 108 (90 Base) MCG/ACT inhaler    Sig: Inhale 2 puffs into the lungs every 4 (four) hours as needed for wheezing or shortness of breath (coughing fits).    Dispense:  18 g    Refill:  2   Diagnostics: Spirometry:  Tracings reviewed. Her effort: Good reproducible efforts. FVC: 3.34L FEV1: 2.64L, 86% predicted FEV1/FVC ratio: 79% Interpretation: Spirometry consistent with normal pattern.  Please see scanned spirometry results for details.  Medication List:  Current Outpatient Medications  Medication Sig Dispense Refill  . albuterol (VENTOLIN HFA) 108 (90 Base) MCG/ACT inhaler Inhale 2 puffs into the lungs every 4 (four) hours as needed for wheezing or shortness of breath (coughing fits). 18 g 2  . fluticasone (FLONASE) 50 MCG/ACT nasal spray Place 1 spray into both nostrils 2 (two) times daily as needed for allergies or rhinitis. 16 g 5  . montelukast (SINGULAIR) 10 MG tablet Take 1 tablet (10 mg total) by mouth at bedtime. 30 tablet 5  . budesonide-formoterol (SYMBICORT) 160-4.5 MCG/ACT inhaler Inhale 2 puffs into the lungs in the morning and at bedtime. with spacer and rinse mouth afterwards. 1 each 5   No current facility-administered medications for this visit.   Allergies: No Known Allergies I reviewed her past medical history, social history, family history, and environmental history and no significant changes have been reported from her previous visit.  Review of Systems  Constitutional:  Negative for appetite change, chills, fever and unexpected weight change.  HENT: Negative for congestion and rhinorrhea.   Eyes: Positive for itching.  Respiratory: Positive for chest tightness, shortness of breath and wheezing.   Gastrointestinal: Negative for abdominal pain.  Skin: Negative for rash.  Allergic/Immunologic: Positive for environmental allergies.  Neurological: Positive for headaches.   Objective: BP 116/80   Pulse 88   Temp 98.1 F (36.7 C)   Resp 18   Ht 5\' 1"  (1.549 m)  Wt 166 lb 9.6 oz (75.6 kg)   SpO2 98%   BMI 31.48 kg/m  Body mass index is 31.48 kg/m. Physical Exam Vitals and nursing note reviewed.  Constitutional:      Appearance: Normal appearance. She is well-developed.  HENT:     Head: Normocephalic and atraumatic.     Right Ear: External ear normal.     Left Ear: External ear normal.     Nose: Nose normal.     Mouth/Throat:     Mouth: Mucous membranes are moist.     Pharynx: Oropharynx is clear.  Eyes:     Conjunctiva/sclera: Conjunctivae normal.  Cardiovascular:     Rate and Rhythm: Normal rate and regular rhythm.     Heart sounds: Normal heart sounds. No murmur heard.   Pulmonary:     Effort: Pulmonary effort is normal.     Breath sounds: Normal breath sounds. No wheezing, rhonchi or rales.  Musculoskeletal:     Cervical back: Neck supple.  Skin:    General: Skin is warm.     Findings: No rash.  Neurological:     Mental Status: She is alert and oriented to person, place, and time.  Psychiatric:        Behavior: Behavior normal.    Previous notes and tests were reviewed. The plan was reviewed with the patient/family, and all questions/concerned were addressed.  It was my pleasure to see Peggy [name] today and participate in her care. Please feel free to contact me with any questions or concerns.  Sincerely,  Wyline Mood, DO Allergy & Immunology  [facility] office: [phone] [facility] office: [phone]

## 2021-01-09 ENCOUNTER — Other Ambulatory Visit: Payer: Self-pay

## 2021-01-09 ENCOUNTER — Ambulatory Visit (INDEPENDENT_AMBULATORY_CARE_PROVIDER_SITE_OTHER): Payer: Medicaid Other | Admitting: Allergy

## 2021-01-09 ENCOUNTER — Encounter: Payer: Self-pay | Admitting: Allergy

## 2021-01-09 VITALS — BP 116/80 | HR 88 | Temp 98.1°F | Resp 18 | Ht 61.0 in | Wt 166.6 lb

## 2021-01-09 DIAGNOSIS — J3089 Other allergic rhinitis: Secondary | ICD-10-CM | POA: Diagnosis not present

## 2021-01-09 DIAGNOSIS — R519 Headache, unspecified: Secondary | ICD-10-CM | POA: Insufficient documentation

## 2021-01-09 DIAGNOSIS — J4541 Moderate persistent asthma with (acute) exacerbation: Secondary | ICD-10-CM | POA: Diagnosis not present

## 2021-01-09 DIAGNOSIS — J45909 Unspecified asthma, uncomplicated: Secondary | ICD-10-CM | POA: Diagnosis not present

## 2021-01-09 MED ORDER — ALBUTEROL SULFATE HFA 108 (90 BASE) MCG/ACT IN AERS: 2.0000 | INHALATION_SPRAY | RESPIRATORY_TRACT | 2 refills | Status: DC | PRN

## 2021-01-09 MED ORDER — BUDESONIDE-FORMOTEROL FUMARATE 160-4.5 MCG/ACT IN AERO: 2.0000 | INHALATION_SPRAY | Freq: Two times a day (BID) | RESPIRATORY_TRACT | 5 refills | Status: DC

## 2021-01-09 NOTE — Assessment & Plan Note (Signed)
   Follow up with PCP regarding headaches.

## 2021-01-09 NOTE — Assessment & Plan Note (Signed)
Past history - 2017 skin testing was positive to cat, cockroach, mold. Interim history - missed zyrtec and has some increased symptoms.   May use over the counter antihistamines such as Zyrtec (cetirizine), Claritin (loratadine), Allegra (fexofenadine), or Xyzal (levocetirizine) daily as needed.  May use Flonase (fluticasone) nasal spray 1 spray per nostril twice a day as needed for nasal congestion.   Continue Singulair 10mg  daily as above.  Re-test for environmental allergies at next visit.

## 2021-01-09 NOTE — Assessment & Plan Note (Signed)
Past history - had COVID-19 twice in 2021. Normal EKG per patient report.  Interim history - CBC normal. Improved with below regimen but still has wheezing and using albuterol at work with good benefit.   Today's spirometry was unremarkable.  . Daily controller medication(s): start Symbicort 2 puffs twice a day with spacer and rinse mouth afterwards - patient was taking only 1 puff in pm and 2 puffs in am. o If breathing is not better in 2 weeks with increased Symbicort, then START Spiriva 1.54mcg 2 puffs daily. Sample given. Demonstrated proper use.  - If this works, then let us know and will send in a prescription for this.  Continue Singulair (montelukast) 10mg  daily at night. . May use albuterol rescue inhaler 2 puffs every 4 to 6 hours as needed for shortness of breath, chest tightness, coughing, and wheezing. May use albuterol rescue inhaler 2 puffs 5 to 15 minutes prior to strenuous physical activities. Monitor frequency of use.  . Repeat spirometry at next visit.

## 2021-01-09 NOTE — Patient Instructions (Addendum)
Asthma: . Daily controller medication(s): start Symbicort 2 puffs twice a day with spacer and rinse mouth afterwards. o If your breathing is not better in 2 weeks then START Spiriva 1.72mcg 2 puffs daily. Sample given. Demonstrated proper use.  - If this works, then let us know and will send in a prescription for this.  Continue Singulair (montelukast) 10mg  daily at night. . May use albuterol rescue inhaler 2 puffs every 4 to 6 hours as needed for shortness of breath, chest tightness, coughing, and wheezing. May use albuterol rescue inhaler 2 puffs 5 to 15 minutes prior to strenuous physical activities. Monitor frequency of use.  . Asthma control goals:  o Full participation in all desired activities (may need albuterol before activity) o Albuterol use two times or less a week on average (not counting use with activity) o Cough interfering with sleep two times or less a month o Oral steroids no more than once a year o No hospitalizations  Allergic rhinitis:  May use over the counter antihistamines such as Zyrtec (cetirizine), Claritin (loratadine), Allegra (fexofenadine), or Xyzal (levocetirizine) daily as needed.  May use Flonase (fluticasone) nasal spray 1 spray per nostril twice a day as needed for nasal congestion.   Continue Singulair 10mg  daily as above.  Follow up in 2 months or sooner if needed to check on your asthma and retest for allergies - must be off antihistamines for 3 days. Follow up with your PCP regarding your headaches.

## 2021-02-19 ENCOUNTER — Emergency Department (HOSPITAL_COMMUNITY)
Admission: EM | Admit: 2021-02-19 | Discharge: 2021-02-19 | Disposition: A | Discharge status: Home / Self Care | Payer: Medicaid Other | Attending: Emergency Medicine | Admitting: Emergency Medicine

## 2021-02-19 ENCOUNTER — Other Ambulatory Visit: Payer: Self-pay

## 2021-02-19 DIAGNOSIS — M545 Low back pain, unspecified: Secondary | ICD-10-CM | POA: Diagnosis present

## 2021-02-19 DIAGNOSIS — Z7951 Long term (current) use of inhaled steroids: Secondary | ICD-10-CM | POA: Diagnosis not present

## 2021-02-19 DIAGNOSIS — J4540 Moderate persistent asthma, uncomplicated: Secondary | ICD-10-CM | POA: Diagnosis not present

## 2021-02-19 LAB — URINALYSIS, ROUTINE W REFLEX MICROSCOPIC
Bilirubin Urine: NEGATIVE
Glucose, UA: NEGATIVE mg/dL
Hgb urine dipstick: NEGATIVE
Ketones, ur: NEGATIVE mg/dL
Leukocytes,Ua: NEGATIVE
Nitrite: NEGATIVE
Protein, ur: NEGATIVE mg/dL
Specific Gravity, Urine: 1.024 (ref 1.005–1.030)
pH: 5 (ref 5.0–8.0)

## 2021-02-19 LAB — PREGNANCY, URINE: Preg Test, Ur: NEGATIVE

## 2021-02-19 MED ORDER — KETOROLAC TROMETHAMINE 60 MG/2ML IM SOLN
60.0000 mg | Freq: Once | INTRAMUSCULAR | Status: AC
  Administered 2021-02-19: 60 mg via INTRAMUSCULAR
  Filled 2021-02-19: qty 2

## 2021-02-19 MED ORDER — METHOCARBAMOL 500 MG PO TABS: 500.0000 mg | ORAL_TABLET | Freq: Four times a day (QID) | ORAL | 0 refills | Status: DC

## 2021-02-19 MED ORDER — DICLOFENAC SODIUM 75 MG PO TBEC: 75.0000 mg | DELAYED_RELEASE_TABLET | Freq: Two times a day (BID) | ORAL | 0 refills | Status: DC

## 2021-02-19 NOTE — ED Notes (Signed)
Pt verbalizes understanding of discharge instructions. Opportunity for questions and answers were provided. Pt discharged from ED.

## 2021-02-19 NOTE — ED Provider Notes (Signed)
Peggy [name] is a 20 y.o. female.  The history is provided by the patient. No language interpreter was used.  Back Pain Location:  Generalized Quality:  Aching Radiates to:  Does not radiate Pain severity:  Moderate Pain is:  Worse during the day Progression:  Worsening Chronicity:  New Worsened by:  Nothing Ineffective treatments:  None tried Associated symptoms: no fever   Pt complains of lower back pain.  Pt reports pain started yesterday. No injury     Past Medical History:  Diagnosis Date  . Asthma   . Insulin resistance    and oligomenorrhea    Patient Active Problem List   Diagnosis Date Noted  . Generalized headache 01/09/2021  . History of bulimia 04/07/2019  . Psychological trauma history 04/07/2019  . Uncomplicated asthma 04/07/2019  . Asthma, not well controlled 10/06/2017  . Epistaxis 05/13/2016  . Seasonal allergic rhinitis due to pollen 05/13/2016  . Oligomenorrhea 02/05/2016  . Insulin resistance 02/05/2016  . Language barrier 02/05/2016  . Moderate persistent asthma 01/21/2016  . Allergic rhinitis 01/21/2016    Past Surgical History:  Procedure Laterality Date  . NO PAST SURGERIES       OB History    Gravida  1   Para      Term      Preterm      AB      Living        SAB      IAB      Ectopic      Multiple      Live Births              Family History  Problem Relation Age of Onset  . Hypertension Mother   . Hypertension Maternal Grandmother   . Allergic rhinitis Neg Hx   . Angioedema Neg Hx   . Asthma Neg Hx   . Eczema Neg Hx   . Immunodeficiency Neg Hx   . Urticaria Neg Hx     Social History   Tobacco Use  . Smoking status: Never Smoker  . Smokeless tobacco: Never Used  Vaping Use  . Vaping Use: Never used  Substance  Use Topics  . Alcohol use: Not Currently    Alcohol/week: 0.0 standard drinks  . Drug use: No    Home Medications Prior to Admission medications   Medication Sig Start Date End Date Taking? Authorizing Provider  albuterol (VENTOLIN HFA) 108 (90 Base) MCG/ACT inhaler Inhale 2 puffs into the lungs every 4 (four) hours as needed for wheezing or shortness of breath (coughing fits). 01/09/21   Ellamae Sia, DO  budesonide-formoterol (SYMBICORT) 160-4.5 MCG/ACT inhaler Inhale 2 puffs into the lungs in the morning and at bedtime. with spacer and rinse mouth afterwards. 01/09/21   Ellamae Sia, DO  fluticasone (FLONASE) 50 MCG/ACT nasal spray Place 1 spray into both nostrils 2 (two) times daily as needed for allergies or rhinitis. 12/03/20   Ellamae Sia, DO  montelukast (SINGULAIR) 10 MG tablet Take 1 tablet (10 mg total) by mouth at bedtime. 12/03/20   Ellamae Sia, DO    Allergies    Patient has no known allergies.  Review of Systems   Review of Systems  Constitutional: Negative for fever.  Musculoskeletal: Positive for back pain.  All other systems reviewed and are negative.   Physical Exam Updated Vital Signs BP 117/79 (BP Location: Left Arm)   Pulse 87   Temp 98.6 F (37 C) (Oral)   Resp 16   Ht 5\' 1"  (1.549 m)   Wt 76.2 kg   SpO2 97%   BMI 31.74 kg/m   Physical Exam Vitals and nursing note reviewed.  Constitutional:      Appearance: She is well-developed and well-nourished.  HENT:     Head: Normocephalic.  Eyes:     Extraocular Movements: EOM normal.  Cardiovascular:     Rate and Rhythm: Normal rate.  Pulmonary:     Effort: Pulmonary effort is normal.  Abdominal:     General: There is no distension.  Musculoskeletal:        General: Tenderness present. Normal range of motion.     Cervical back: Normal range of motion.     Comments: Diffuse tenderness ls spine no rash, from   Neurological:     General: No focal deficit present.     Mental Status: She is alert and  oriented to person, place, and time.  Psychiatric:        Mood and Affect: Mood and affect and mood normal.     ED Results / Procedures / Treatments   Labs (all labs ordered are listed, but only abnormal results are displayed) Labs Reviewed  URINALYSIS, ROUTINE W REFLEX MICROSCOPIC  PREGNANCY, URINE    EKG None  Radiology No results found.  Procedures Procedures   Medications Ordered in ED Medications  ketorolac (TORADOL) injection 60 mg (60 mg Intramuscular Given 02/19/21 0941)    ED Course  I have reviewed the triage vital signs and the nursing notes.  Pertinent labs & imaging results that were available during my care of the patient were reviewed by me and considered in my medical decision making (see chart for details).    MDM Rules/Calculators/A&P                          UA and upt negative  Pt given Toradol Im.  I suspect pain is muscular.  Pt given rx for voltaren and robaxin  Final Clinical Impression(s) / ED Diagnoses Final diagnoses:  Acute low back pain without sciatica, unspecified back pain laterality    Rx / DC Orders ED Discharge Orders         Ordered    diclofenac (VOLTAREN) 75 MG EC tablet  2 times daily        02/19/21 0952    methocarbamol (ROBAXIN) 500 MG tablet  4 times daily        02/19/21 04/21/21        An After Visit Summary was printed and given to the patient.    [id number], Elson Areas 02/19/21 04/21/21    Tegeler, [id number], MD 02/19/21 [phone]

## 2021-02-19 NOTE — ED Triage Notes (Signed)
Pt arrived to triage ambulatory caox4. Pt c/o lower back pain that she woke up with around 1 am worsening on movement. Pt reports sharp pain in mid lower lumbar region radiating down legs bilateral with some tingling. Pt denied recent injury or fall.

## 2021-02-19 NOTE — Discharge Instructions (Signed)
Return if any problems.

## 2021-02-19 NOTE — ED Notes (Signed)
Pt given [place] sandwich bag per PA.

## 2021-02-25 ENCOUNTER — Ambulatory Visit
Admission: RE | Admit: 2021-02-25 | Discharge: 2021-02-25 | Disposition: A | Discharge status: Home / Self Care | Payer: Medicaid Other | Source: Ambulatory Visit | Attending: Family Medicine | Admitting: Family Medicine

## 2021-02-25 ENCOUNTER — Other Ambulatory Visit: Payer: Self-pay | Admitting: Family Medicine

## 2021-02-25 DIAGNOSIS — M545 Low back pain, unspecified: Secondary | ICD-10-CM

## 2021-03-11 ENCOUNTER — Encounter: Payer: Self-pay | Admitting: Allergy

## 2021-03-11 ENCOUNTER — Other Ambulatory Visit: Payer: Self-pay

## 2021-03-11 ENCOUNTER — Ambulatory Visit (INDEPENDENT_AMBULATORY_CARE_PROVIDER_SITE_OTHER): Payer: Medicaid Other | Admitting: Allergy

## 2021-03-11 VITALS — BP 118/78 | HR 97 | Temp 98.1°F | Resp 18 | Ht 61.0 in | Wt 171.8 lb

## 2021-03-11 DIAGNOSIS — J3089 Other allergic rhinitis: Secondary | ICD-10-CM

## 2021-03-11 DIAGNOSIS — J4540 Moderate persistent asthma, uncomplicated: Secondary | ICD-10-CM | POA: Diagnosis not present

## 2021-03-11 MED ORDER — BUDESONIDE-FORMOTEROL FUMARATE 160-4.5 MCG/ACT IN AERO: 2.0000 | INHALATION_SPRAY | Freq: Two times a day (BID) | RESPIRATORY_TRACT | 5 refills | Status: DC

## 2021-03-11 MED ORDER — MONTELUKAST SODIUM 10 MG PO TABS: 10.0000 mg | ORAL_TABLET | Freq: Every day | ORAL | 5 refills | Status: DC

## 2021-03-11 MED ORDER — ALBUTEROL SULFATE HFA 108 (90 BASE) MCG/ACT IN AERS: 2.0000 | INHALATION_SPRAY | RESPIRATORY_TRACT | 1 refills | Status: AC | PRN

## 2021-03-11 NOTE — Assessment & Plan Note (Signed)
Past history - 2017 skin testing was positive to cat, cockroach, mold. Interim history - increased symptoms.   Today's skin testing showed: Positive to cockroach. Borderline to grass pollen.   Start environmental control measures as below.   May use over the counter antihistamines such as Zyrtec (cetirizine), Claritin (loratadine), Allegra (fexofenadine), or Xyzal (levocetirizine) daily as needed.  May use Flonase (fluticasone) nasal spray 1 spray per nostril twice a day as needed for nasal congestion.   Continue Singulair 10mg  daily as above.

## 2021-03-11 NOTE — Patient Instructions (Addendum)
Today's skin testing showed:  Positive to cockroach. Borderline to grass pollen.   Asthma: . Daily controller medication(s): start Symbicort 2 puffs twice a day with spacer and rinse mouth afterwards. Continue Singulair (montelukast) 10mg  daily at night. Stop Spiriva for now.  . May use albuterol rescue inhaler 2 puffs every 4 to 6 hours as needed for shortness of breath, chest tightness, coughing, and wheezing. May use albuterol rescue inhaler 2 puffs 5 to 15 minutes prior to strenuous physical activities. Monitor frequency of use.  . Asthma control goals:  o Full participation in all desired activities (may need albuterol before activity) o Albuterol use two times or less a week on average (not counting use with activity) o Cough interfering with sleep two times or less a month o Oral steroids no more than once a year o No hospitalizations  Allergic rhinitis:  Start environmental control measures as below.   May use over the counter antihistamines such as Zyrtec (cetirizine), Claritin (loratadine), Allegra (fexofenadine), or Xyzal (levocetirizine) daily as needed.  May use Flonase (fluticasone) nasal spray 1 spray per nostril twice a day as needed for nasal congestion.   Continue Singulair 10mg  daily as above.  Follow up in 3 months or sooner if needed.  Cockroach Allergen Avoidance Cockroaches are often found in the homes of densely populated urban areas, schools or commercial buildings, but these creatures can lurk almost anywhere. This does not mean that you have a dirty house or living area. . Block all areas where roaches can enter the home. This includes crevices, wall cracks and windows.  . Cockroaches need water to survive, so fix and seal all leaky faucets and pipes. Have an exterminator go through the house when your family and pets are gone to eliminate any remaining roaches. Keep food in lidded containers and put pet food dishes away after your pets are done  eating. Vacuum and sweep the floor after meals, and take out garbage and recyclables. Use lidded garbage containers in the kitchen. Wash dishes immediately after use and clean under stoves, refrigerators or toasters where crumbs can accumulate. Wipe off the stove and other kitchen surfaces and cupboards regularly.  Reducing Pollen Exposure . Pollen seasons: trees (spring), grass (summer) and ragweed/weeds (fall). 12-27-1982 Keep windows closed in your home and car to lower pollen exposure.  04-30-1981 air conditioning in the bedroom and throughout the house if possible.  . Avoid going out in dry windy days - especially early morning. . Pollen counts are highest between 5 - 10 AM and on dry, hot and windy days.  . Save outside activities for late afternoon or after a heavy rain, when pollen levels are lower.  . Avoid mowing of grass if you have grass pollen allergy. Marland Kitchen Be aware that pollen can also be transported indoors on people and pets.  . Dry your clothes in an automatic dryer rather than hanging them outside where they might collect pollen.  . Rinse hair and eyes before bedtime.

## 2021-03-11 NOTE — Progress Notes (Signed)
Follow Up Note  RE: Peggy [name] MRN: [id number] DOB: 2000/12/26 Date of Office Visit: 03/11/2021  Referring provider: Verlon Au, MD Primary care provider: Verlon Au, MD  Chief Complaint: Asthma (ACT -18 ) and Allergy Testing (Moved into new apartment not sure the cause but has been having allergic reactions since she moved it wants an updated allergy test)  History of Present Illness: I had the pleasure of seeing Peggy [name] for a follow up visit at the [facility] on 03/12/2021. She is a 20 y.o. female, who is being followed for asthma, allergic rhinitis and headaches. Her previous allergy office visit was on 01/09/2021 with Dr. Selena Batten. Today is a regular follow up visit.  Asthma ACT score 18. Patient used Symbicort 2 puffs BID up until about 10 days ago. Then she switched to Spiriva 1.43mcg 2 puffs daily as she thought it was similar to Symbicort. Noted improvement in her breathing with the Symbicort.  Using albuterol at most 3 times per week with good benefit.   Still taking Singulair at night.  Some wheezing in the mornings.  Denies any SOB, coughing, chest tightness, nocturnal awakenings, ER/urgent care visits or prednisone use since the last visit.  Allergic rhinitis Having constant itchy nose. Patient was using Flonase prn and Claritin prn with good benefit.   Generalized headache Taking Excedrin prn.  Assessment and Plan: [name] is a 20 y.o. female with: Moderate persistent asthma without complication Past history - had COVID-19 twice in 2021. Normal EKG per patient report.  Interim history - improved with Symbicort but switched to Spiriva about 1-2 weeks ago due to miscommunication.  Today's spirometry was unremarkable.   ACT score 18. . Daily controller medication(s): start Symbicort 2 puffs twice a day with spacer and rinse mouth afterwards. Continue Singulair  (montelukast) 10mg  daily at night. Stop Spiriva for now.  . May use albuterol rescue inhaler 2 puffs every 4 to 6 hours as needed for shortness of breath, chest tightness, coughing, and wheezing. May use albuterol rescue inhaler 2 puffs 5 to 15 minutes prior to strenuous physical activities. Monitor frequency of use.  . Repeat spirometry at next visit.  Allergic rhinitis Past history - 2017 skin testing was positive to cat, cockroach, mold. Interim history - increased symptoms.   Today's skin testing showed: Positive to cockroach. Borderline to grass pollen.   Start environmental control measures as below.   May use over the counter antihistamines such as Zyrtec (cetirizine), Claritin (loratadine), Allegra (fexofenadine), or Xyzal (levocetirizine) daily as needed.  May use Flonase (fluticasone) nasal spray 1 spray per nostril twice a day as needed for nasal congestion.   Continue Singulair 10mg  daily as above.  Return in about 3 months (around 06/11/2021).  Meds ordered this encounter  Medications  . budesonide-formoterol (SYMBICORT) 160-4.5 MCG/ACT inhaler    Sig: Inhale 2 puffs into the lungs in the morning and at bedtime. with spacer and rinse mouth afterwards.    Dispense:  1 each    Refill:  5  . albuterol (VENTOLIN HFA) 108 (90 Base) MCG/ACT inhaler    Sig: Inhale 2 puffs into the lungs every 4 (four) hours as needed for wheezing or shortness of breath (coughing fits).    Dispense:  18 g    Refill:  1  . montelukast (SINGULAIR) 10 MG tablet    Sig: Take 1 tablet (10 mg total) by mouth at bedtime.    Dispense:  30 tablet    Refill:  5   Lab Orders  No laboratory test(s) ordered today    Diagnostics: Spirometry:  Tracings reviewed. Her effort: It was hard to get consistent efforts and there is a question as to whether this reflects a maximal maneuver. FVC: 2.95L FEV1: 2.53L, 82% predicted FEV1/FVC ratio: 86% Interpretation: No overt abnormalities noted given today's  efforts.  Please see scanned spirometry results for details.  Skin Testing: Environmental allergy panel. Positive to cockroach. Borderline to grass pollen.  Results discussed with patient/family.  Airborne Adult Perc - 03/11/21 1402    Time Antigen Placed 1402    Allergen Manufacturer Waynette Buttery    Location Back    Number of Test 59    Panel 1 Select    1. Control-Buffer 50% Glycerol Negative    2. Control-Histamine 1 mg/ml 2+    3. Albumin saline Negative    4. Bahia Negative    5. French Southern Territories Negative    6. Johnson Negative    7. Kentucky Blue Negative    8. Meadow Fescue Negative    9. Perennial Rye Negative    10. Sweet Vernal Negative    11. Timothy Negative    12. Cocklebur Negative    13. Burweed Marshelder Negative    14. Ragweed, short Negative    15. Ragweed, Giant Negative    16. Plantain,  English Negative    17. Lamb's Quarters Negative    18. Sheep Sorrell Negative    19. Rough Pigweed Negative    20. Marsh Elder, Rough Negative    21. Mugwort, Common Negative    22. Ash mix Negative    23. Birch mix Negative    24. Beech American Negative    25. Box, Elder Negative    26. Cedar, red Negative    27. Cottonwood, Guinea-Bissau Negative    28. Elm mix Negative    29. Hickory Negative    30. Maple mix Negative    31. Oak, Guinea-[place]issau mix Negative    32. Pecan Pollen Negative    33. Pine mix Negative    34. Sycamore Eastern Negative    35. Walnut, Black Pollen Negative    36. Alternaria alternata Negative    37. Cladosporium Herbarum Negative    38. Aspergillus mix Negative    39. Penicillium mix Negative    40. Bipolaris sorokiniana (Helminthosporium) Negative    41. Drechslera spicifera (Curvularia) Negative    42. Mucor plumbeus Negative    43. Fusarium moniliforme Negative    44. Aureobasidium pullulans (pullulara) Negative    45. Rhizopus oryzae Negative    46. Botrytis cinera Negative    47. Epicoccum nigrum Negative    48. Phoma betae Negative    49. Candida  Albicans Negative    50. Trichophyton mentagrophytes Negative    51. Mite, D Farinae  5,000 AU/ml Negative    52. Mite, D Pteronyssinus  5,000 AU/ml Negative    53. Cat Hair 10,000 BAU/ml Negative    54.  Dog Epithelia Negative    55. Mixed Feathers Negative    56. Horse Epithelia Negative    57. Cockroach, German 2+    58. Mouse Negative    59. Tobacco Leaf Negative          Intradermal - 03/11/21 1523    Time Antigen Placed 1500    Allergen Manufacturer Waynette Buttery    Location Arm    Number of Test 14    Intradermal Select  Control Negative    French Southern Territories Negative    Johnson Negative    7 Grass --   +/-   Ragweed mix Negative    Weed mix Negative    Tree mix Negative    Mold 1 Negative    Mold 2 Negative    Mold 3 Negative    Mold 4 Negative    Cat Negative    Dog Negative    Cockroach Omitted    Mite mix Negative           Medication List:  Current Outpatient Medications  Medication Sig Dispense Refill  . diclofenac (VOLTAREN) 75 MG EC tablet Take 1 tablet (75 mg total) by mouth 2 (two) times daily. 20 tablet 0  . fluticasone (FLONASE) 50 MCG/ACT nasal spray Place 1 spray into both nostrils 2 (two) times daily as needed for allergies or rhinitis. 16 g 5  . methocarbamol (ROBAXIN) 500 MG tablet Take 1 tablet (500 mg total) by mouth 4 (four) times daily. 20 tablet 0  . albuterol (VENTOLIN HFA) 108 (90 Base) MCG/ACT inhaler Inhale 2 puffs into the lungs every 4 (four) hours as needed for wheezing or shortness of breath (coughing fits). 18 g 1  . budesonide-formoterol (SYMBICORT) 160-4.5 MCG/ACT inhaler Inhale 2 puffs into the lungs in the morning and at bedtime. with spacer and rinse mouth afterwards. 1 each 5  . montelukast (SINGULAIR) 10 MG tablet Take 1 tablet (10 mg total) by mouth at bedtime. 30 tablet 5   No current facility-administered medications for this visit.   Allergies: No Known Allergies I reviewed her past medical history, social history, family history,  and environmental history and no significant changes have been reported from her previous visit.  Review of Systems  Constitutional: Negative for appetite change, chills, fever and unexpected weight change.  HENT: Negative for congestion and rhinorrhea.   Eyes: Negative for itching.  Respiratory: Positive for wheezing. Negative for chest tightness and shortness of breath.   Gastrointestinal: Negative for abdominal pain.  Skin: Negative for rash.  Allergic/Immunologic: Positive for environmental allergies.  Neurological: Positive for headaches.   Objective: BP 118/78   Pulse 97   Temp 98.1 F (36.7 C)   Resp 18   Ht 5\' 1"  (1.549 m)   Wt 171 lb 12.8 oz (77.9 kg)   SpO2 98%   BMI 32.46 kg/m  Body mass index is 32.46 kg/m. Physical Exam Vitals and nursing note reviewed.  Constitutional:      Appearance: Normal appearance. She is well-developed.  HENT:     Head: Normocephalic and atraumatic.     Right Ear: External ear normal.     Left Ear: External ear normal.     Nose: Nose normal.     Mouth/Throat:     Mouth: Mucous membranes are moist.     Pharynx: Oropharynx is clear.  Eyes:     Conjunctiva/sclera: Conjunctivae normal.  Cardiovascular:     Rate and Rhythm: Normal rate and regular rhythm.     Heart sounds: Normal heart sounds. No murmur heard.   Pulmonary:     Effort: Pulmonary effort is normal.     Breath sounds: Normal breath sounds. No wheezing, rhonchi or rales.  Musculoskeletal:     Cervical back: Neck supple.  Skin:    General: Skin is warm.     Findings: No rash.  Neurological:     Mental Status: She is alert and oriented to person, place, and time.  Psychiatric:  Behavior: Behavior normal.    Previous notes and tests were reviewed. The plan was reviewed with the patient/family, and all questions/concerned were addressed.  It was my pleasure to see Peggy [name] today and participate in her care. Please feel free to contact me with any questions  or concerns.  Sincerely,  Wyline MoodYoon Wynelle Dreier, DO Allergy & Immunology  [facility]  [place] office: [phone] [facility] office: [phone]

## 2021-03-11 NOTE — Assessment & Plan Note (Addendum)
Past history - had COVID-19 twice in 2021. Normal EKG per patient report.  Interim history - improved with Symbicort but switched to Spiriva about 1-2 weeks ago due to miscommunication.  Today's spirometry was unremarkable.   ACT score 18. . Daily controller medication(s): start Symbicort 2 puffs twice a day with spacer and rinse mouth afterwards. Continue Singulair (montelukast) 10mg  daily at night. Stop Spiriva for now.  . May use albuterol rescue inhaler 2 puffs every 4 to 6 hours as needed for shortness of breath, chest tightness, coughing, and wheezing. May use albuterol rescue inhaler 2 puffs 5 to 15 minutes prior to strenuous physical activities. Monitor frequency of use.  . Repeat spirometry at next visit.

## 2021-03-12 ENCOUNTER — Encounter: Payer: Self-pay | Admitting: Allergy

## 2021-03-27 ENCOUNTER — Encounter: Payer: Self-pay | Admitting: Allergy

## 2021-03-28 ENCOUNTER — Ambulatory Visit (INDEPENDENT_AMBULATORY_CARE_PROVIDER_SITE_OTHER): Payer: Medicaid Other | Admitting: Family Medicine

## 2021-03-28 ENCOUNTER — Encounter: Payer: Self-pay | Admitting: Family Medicine

## 2021-03-28 ENCOUNTER — Other Ambulatory Visit: Payer: Self-pay

## 2021-03-28 VITALS — BP 110/60 | HR 74 | Temp 97.8°F | Resp 20

## 2021-03-28 DIAGNOSIS — J4541 Moderate persistent asthma with (acute) exacerbation: Secondary | ICD-10-CM

## 2021-03-28 DIAGNOSIS — J3089 Other allergic rhinitis: Secondary | ICD-10-CM

## 2021-03-28 DIAGNOSIS — K219 Gastro-esophageal reflux disease without esophagitis: Secondary | ICD-10-CM | POA: Diagnosis not present

## 2021-03-28 DIAGNOSIS — J302 Other seasonal allergic rhinitis: Secondary | ICD-10-CM

## 2021-03-28 MED ORDER — FAMOTIDINE 20 MG PO TABS: 20.0000 mg | ORAL_TABLET | Freq: Two times a day (BID) | ORAL | 5 refills | Status: DC

## 2021-03-28 NOTE — Progress Notes (Addendum)
[address] Debbora Presto [place] [address] Dept: [phone]  FOLLOW UP NOTE  Patient ID: Peggy [name], female    DOB: 10/29/2001  Age: 20 y.o. MRN: [id number] Date of Office Visit: 03/28/2021  Assessment  Chief Complaint: Asthma (Asthma flare that started last night. HFW:YOVZC tightness, rapid HR, wheezing. Denies cough)  HPI Peggy [name] is a 20 year old female who presents the clinic for evaluation of shortness of breath that began last night.  She was last seen in this clinic on 03/11/2021 by Dr. Selena Batten for evaluation of asthma, allergic rhinitis, and generalized headache.  At today's visit, she reports that yesterday she began to experience symptoms including shortness of breath, epigastric chest tightness which is worse when lying down, wheeze which occurred when lying down, and dry cough.  She reports that she could only take rapid short breaths beginning yesterday and lasting into today.  She reports chest tightness as beginning yesterday and occurring in an on-and-off pattern.  She reports feeling a dull overall chest tightness with a shortness when she takes of breath which worsened while lying down and resolved slightly while upright.  She continues montelukast 10 mg once a day, Symbicort 160-2 puffs twice a day with a spacer, and albuterol about once or twice a week.  She does report using albuterol several times last night with no relief of symptoms.  She reports heartburn that occurs daily with nausea and vomiting occurring 1 time yesterday.  She does report that she is trying to get pregnant at this time with her last menstrual cycle beginning around February 27, 2021.  She is not currently taking any medication for reflux.  Allergic rhinitis is reported as moderately well controlled with Flonase cetirizine 10 mg once a day.  She does report that she feels very anxious about breathing well.  Her current medications are listed in the chart.  Drug  Allergies:  No Known Allergies  Physical Exam: BP 110/60   Pulse 74   Temp 97.8 F (36.6 C) (Tympanic)   Resp 20   SpO2 98%    Physical Exam Vitals reviewed.  Constitutional:      Appearance: Normal appearance.  HENT:     Head: Normocephalic and atraumatic.     Right Ear: Tympanic membrane normal.     Left Ear: Tympanic membrane normal.     Nose:     Comments: Bilateral nares normal.  Pharynx normal.  Ears normal.  Eyes normal.    Mouth/Throat:     Pharynx: Oropharynx is clear.  Eyes:     Conjunctiva/sclera: Conjunctivae normal.  Cardiovascular:     Rate and Rhythm: Normal rate and regular rhythm.     Heart sounds: Normal heart sounds. No murmur heard.   Pulmonary:     Effort: Pulmonary effort is normal.     Breath sounds: Normal breath sounds.     Comments: Lungs clear to auscultation Musculoskeletal:     Cervical back: Normal range of motion and neck supple.  Neurological:     Mental Status: She is alert.     Diagnostics: FVC 3.32, FEV1 3.02.  Predicted FVC 3.50, predicted FEV1 3.06.  Spirometry indicates normal ventilatory function.  Postbronchodilator FVC 3.60, FEV1 3.20.  Postbronchodilator spirometry indicates no significant bronchodilator response.  Assessment and Plan: 1. Moderate persistent asthma with acute exacerbation   2. Seasonal and perennial allergic rhinitis   3. Gastroesophageal reflux disease, unspecified whether esophagitis present     Meds ordered this encounter  Medications  . famotidine (PEPCID) 20 MG tablet    Sig: Take 1 tablet (20 mg total) by mouth 2 (two) times daily.    Dispense:  60 tablet    Refill:  5    Patient Instructions  Asthma Begin prednisone 10 mg tablets. Take 2 tablets twice a day for 3 days, then take 2 tablets once a day for 1 day, then take 1 tablet on the 5th day, then stop continue montelukast 10 mg once a day to prevent cough or wheeze Continue Symbicort 160-2 puffs twice a day to prevent cough or  wheeze Continue albuterol 2 puffs once every 4 hours as needed for cough or wheeze You may use albuterol 2 puffs 5 to 15 minutes before activity to prevent cough or wheeze  Allergic rhinitis Continue allergen avoidance measures directed toward cockroach and and grass pollen as listed below Continue cetirizine 10 mg once a day as needed for runny nose or itch Begin Rhinocort 2 sprays in each nostril once a day as needed for a stuffy nose.  This will replace Flonase.  In the right nostril, point the applicator out toward the right ear. In the left nostril, point the applicator out toward the left ear Consider saline nasal rinses as needed for nasal symptoms. Use this before any medicated nasal sprays for best result  Reflux Begin dietary and lifestyle modifications as listed below Begin famotidine 20 mg twice a day to control reflux  Chest tightness If your chest tightness does not resolve or becomes worse go to emergency department for urgent evaluation.  Since you are trying to get pregnant, have your OB/GYN provider evaluate the safety of your medications before you get pregnant  Call the clinic if this treatment plan is not working well for you  Follow up in 2 weeks or sooner if needed.   Return in about 2 weeks (around 04/11/2021), or if symptoms worsen or fail to improve.    Thank you for the opportunity to care for this patient.  Please do not hesitate to contact me with questions.  Thermon Leyland, FNP [facility]

## 2021-03-28 NOTE — Patient Instructions (Addendum)
Asthma Begin prednisone 10 mg tablets. Take 2 tablets twice a day for 3 days, then take 2 tablets once a day for 1 day, then take 1 tablet on the 5th day, then stop continue montelukast 10 mg once a day to prevent cough or wheeze Continue Symbicort 160-2 puffs twice a day to prevent cough or wheeze Continue albuterol 2 puffs once every 4 hours as needed for cough or wheeze You may use albuterol 2 puffs 5 to 15 minutes before activity to prevent cough or wheeze  Allergic rhinitis Continue allergen avoidance measures directed toward cockroach and and grass pollen as listed below Continue cetirizine 10 mg once a day as needed for runny nose or itch Begin Rhinocort 2 sprays in each nostril once a day as needed for a stuffy nose.  This will replace Flonase.  In the right nostril, point the applicator out toward the right ear. In the left nostril, point the applicator out toward the left ear Consider saline nasal rinses as needed for nasal symptoms. Use this before any medicated nasal sprays for best result  Reflux Begin dietary and lifestyle modifications as listed below Begin famotidine 20 mg twice a day to control reflux  Chest tightness If your chest tightness does not resolve or becomes worse go to emergency department for urgent evaluation.  Since you are trying to get pregnant, have your OB/GYN provider evaluate the safety of your medications before you get pregnant  Call the clinic if this treatment plan is not working well for you  Follow up in 2 weeks or sooner if needed.

## 2021-03-28 NOTE — Telephone Encounter (Signed)
Pt scheduled appt with Thurston Hole today 9:30 am.

## 2021-06-12 ENCOUNTER — Ambulatory Visit: Payer: Medicaid Other | Admitting: Allergy

## 2021-06-12 NOTE — Progress Notes (Deleted)
Follow Up Note  RE: Peggy [name] MRN: [id number] DOB: 2001/11/18 Date of Office Visit: 06/12/2021  Referring provider: Verlon Au, MD Primary care provider: Verlon Au, MD  Chief Complaint: No chief complaint on file.  History of Present Illness: I had the pleasure of seeing Peggy [name] for a follow up visit at the [facility] on 06/12/2021. She is a 20 y.o. female, who is being followed for asthma, allergic rhinitis, reflux and chest tightness. Her previous allergy office visit was on 03/28/2021 with Thermon Leyland, FNP. Today is a regular follow up visit.  Asthma Begin prednisone 10 mg tablets. Take 2 tablets twice a day for 3 days, then take 2 tablets once a day for 1 day, then take 1 tablet on the 5th day, then stop continue montelukast 10 mg once a day to prevent cough or wheeze Continue Symbicort 160-2 puffs twice a day to prevent cough or wheeze Continue albuterol 2 puffs once every 4 hours as needed for cough or wheeze You may use albuterol 2 puffs 5 to 15 minutes before activity to prevent cough or wheeze   Allergic rhinitis Continue allergen avoidance measures directed toward cockroach and and grass pollen as listed below Continue cetirizine 10 mg once a day as needed for runny nose or itch Begin Rhinocort 2 sprays in each nostril once a day as needed for a stuffy nose.  This will replace Flonase.  In the right nostril, point the applicator out toward the right ear. In the left nostril, point the applicator out toward the left ear Consider saline nasal rinses as needed for nasal symptoms. Use this before any medicated nasal sprays for best result   Reflux Begin dietary and lifestyle modifications as listed below Begin famotidine 20 mg twice a day to control reflux   Chest tightness If your chest tightness does not resolve or becomes worse go to emergency department for urgent evaluation.   Since you are  trying to get pregnant, have your OB/GYN provider evaluate the safety of your medications before you get pregnant    Assessment and Plan: Peggy [name] is a 20 y.o. female with: No problem-specific Assessment & Plan notes found for this encounter.  No follow-ups on file.  No orders of the defined types were placed in this encounter.  Lab Orders  No laboratory test(s) ordered today    Diagnostics: Spirometry:  Tracings reviewed. Her effort: {Blank single:19197::"Good reproducible efforts.","It was hard to get consistent efforts and there is a question as to whether this reflects a maximal maneuver.","Poor effort, data can not be interpreted."} FVC: ***L FEV1: ***L, ***% predicted FEV1/FVC ratio: ***% Interpretation: {Blank single:19197::"Spirometry consistent with mild obstructive disease","Spirometry consistent with moderate obstructive disease","Spirometry consistent with severe obstructive disease","Spirometry consistent with possible restrictive disease","Spirometry consistent with mixed obstructive and restrictive disease","Spirometry uninterpretable due to technique","Spirometry consistent with normal pattern","No overt abnormalities noted given today's efforts"}.  Please see scanned spirometry results for details.  Skin Testing: {Blank single:19197::"Select foods","Environmental allergy panel","Environmental allergy panel and select foods","Food allergy panel","None","Deferred due to recent antihistamines use"}. Positive test to: ***. Negative test to: ***.  Results discussed with patient/family.   Medication List:  Current Outpatient Medications  Medication Sig Dispense Refill   albuterol (VENTOLIN HFA) 108 (90 Base) MCG/ACT inhaler Inhale 2 puffs into the lungs every 4 (four) hours as needed for wheezing or shortness of breath (coughing fits). 18 g 1   budesonide-formoterol (SYMBICORT) 160-4.5 MCG/ACT inhaler Inhale 2 puffs  into the lungs in the morning and at bedtime. with  spacer and rinse mouth afterwards. 1 each 5   diclofenac (VOLTAREN) 75 MG EC tablet Take 1 tablet (75 mg total) by mouth 2 (two) times daily. 20 tablet 0   famotidine (PEPCID) 20 MG tablet Take 1 tablet (20 mg total) by mouth 2 (two) times daily. 60 tablet 5   fluticasone (FLONASE) 50 MCG/ACT nasal spray Place 1 spray into both nostrils 2 (two) times daily as needed for allergies or rhinitis. 16 g 5   methocarbamol (ROBAXIN) 500 MG tablet Take 1 tablet (500 mg total) by mouth 4 (four) times daily. 20 tablet 0   montelukast (SINGULAIR) 10 MG tablet Take 1 tablet (10 mg total) by mouth at bedtime. 30 tablet 5   No current facility-administered medications for this visit.   Allergies: No Known Allergies I reviewed her past medical history, social history, family history, and environmental history and no significant changes have been reported from her previous visit.  Review of Systems  Constitutional:  Negative for appetite change, chills, fever and unexpected weight change.  HENT:  Negative for congestion and rhinorrhea.   Eyes:  Negative for itching.  Respiratory:  Positive for wheezing. Negative for chest tightness and shortness of breath.   Gastrointestinal:  Negative for abdominal pain.  Skin:  Negative for rash.  Allergic/Immunologic: Positive for environmental allergies.  Neurological:  Positive for headaches.   Objective: There were no vitals taken for this visit. There is no height or weight on file to calculate BMI. Physical Exam Vitals and nursing note reviewed.  Constitutional:      Appearance: Normal appearance. She is well-developed.  HENT:     Head: Normocephalic and atraumatic.     Right Ear: External ear normal.     Left Ear: External ear normal.     Nose: Nose normal.     Mouth/Throat:     Mouth: Mucous membranes are moist.     Pharynx: Oropharynx is clear.  Eyes:     Conjunctiva/sclera: Conjunctivae normal.  Cardiovascular:     Rate and Rhythm: Normal rate and  regular rhythm.     Heart sounds: Normal heart sounds. No murmur heard. Pulmonary:     Effort: Pulmonary effort is normal.     Breath sounds: Normal breath sounds. No wheezing, rhonchi or rales.  Musculoskeletal:     Cervical back: Neck supple.  Skin:    General: Skin is warm.     Findings: No rash.  Neurological:     Mental Status: She is alert and oriented to person, place, and time.  Psychiatric:        Behavior: Behavior normal.   Previous notes and tests were reviewed. The plan was reviewed with the patient/family, and all questions/concerned were addressed.  It was my pleasure to see Daylee Delahoz today and participate in her care. Please feel free to contact me with any questions or concerns.  Sincerely,  Wyline Mood, DO Allergy & Immunology  [facility] office: [phone] [facility] office: [phone]

## 2021-07-17 ENCOUNTER — Other Ambulatory Visit: Payer: Self-pay

## 2021-07-17 ENCOUNTER — Emergency Department (HOSPITAL_COMMUNITY)
Admission: EM | Admit: 2021-07-17 | Discharge: 2021-07-17 | Disposition: A | Discharge status: Home / Self Care | Payer: Medicaid Other | Attending: Student | Admitting: Student

## 2021-07-17 ENCOUNTER — Encounter (HOSPITAL_COMMUNITY): Payer: Self-pay | Admitting: *Deleted

## 2021-07-17 ENCOUNTER — Emergency Department (HOSPITAL_COMMUNITY): Payer: Medicaid Other

## 2021-07-17 DIAGNOSIS — M25572 Pain in left ankle and joints of left foot: Secondary | ICD-10-CM | POA: Insufficient documentation

## 2021-07-17 DIAGNOSIS — W010XXA Fall on same level from slipping, tripping and stumbling without subsequent striking against object, initial encounter: Secondary | ICD-10-CM | POA: Insufficient documentation

## 2021-07-17 DIAGNOSIS — M25561 Pain in right knee: Secondary | ICD-10-CM | POA: Insufficient documentation

## 2021-07-17 DIAGNOSIS — Z5321 Procedure and treatment not carried out due to patient leaving prior to being seen by health care provider: Secondary | ICD-10-CM | POA: Diagnosis not present

## 2021-07-17 NOTE — ED Provider Notes (Signed)
Emergency Medicine Provider Triage Evaluation Note  Peggy [name] , a 20 y.o. female  was evaluated in triage.  Pt complains of left ankle pain, started today, states she was at work slipped on water and inverted her left ankle.  She states she has severe pain when she applies weight on it, she denies paresthesias or weakness in that foot, she denies hitting her head, losing conscious, is not on anticoagulant, she does endorse some right knee pain but states this discomfort is bad, she is bruised.  Has no other complaints..  Review of Systems  Positive: Left ankle pain, right knee pain Negative: Headaches, neck pain  Physical Exam  BP 114/86 (BP Location: Right Arm)   Pulse 87   Temp 98.4 F (36.9 C) (Oral)   Resp 16   SpO2 99%  Gen:   Awake, no distress   Resp:  Normal effort  MSK:   Moves extremities without difficulty  Other:    Medical Decision Making  Medically screening exam initiated at 7:22 PM.  Appropriate orders placed.  Peggy [name] was informed that the remainder of the evaluation will be completed by another provider, this initial triage assessment does not replace that evaluation, and the importance of remaining in the ED until their evaluation is complete.  Presents with right ankle pain.  Patient need further work-up.   Carroll Sage, PA-C 07/17/21 1924    Margarita Grizzle, MD 07/21/21 [phone]

## 2021-07-17 NOTE — ED Triage Notes (Signed)
Pt slipped in water and now c/o left ankle pain.

## 2021-07-17 NOTE — ED Notes (Signed)
Pt stated she will see her provider in morning, advised to return if symptoms worsen.

## 2021-08-13 ENCOUNTER — Other Ambulatory Visit: Payer: Self-pay | Admitting: Family Medicine

## 2021-08-13 DIAGNOSIS — R102 Pelvic and perineal pain: Secondary | ICD-10-CM

## 2021-08-22 ENCOUNTER — Other Ambulatory Visit: Payer: Self-pay

## 2021-08-22 ENCOUNTER — Ambulatory Visit
Admission: RE | Admit: 2021-08-22 | Discharge: 2021-08-22 | Disposition: A | Discharge status: Home / Self Care | Payer: Medicaid Other | Source: Ambulatory Visit | Attending: Family Medicine | Admitting: Family Medicine

## 2021-08-22 DIAGNOSIS — R102 Pelvic and perineal pain: Secondary | ICD-10-CM

## 2021-10-06 ENCOUNTER — Inpatient Hospital Stay (HOSPITAL_COMMUNITY)
Admission: AD | Admit: 2021-10-06 | Discharge: 2021-10-07 | Disposition: A | Discharge status: Home / Self Care | Payer: Medicaid Other | Attending: Obstetrics and Gynecology | Admitting: Obstetrics and Gynecology

## 2021-10-06 ENCOUNTER — Other Ambulatory Visit: Payer: Self-pay

## 2021-10-06 ENCOUNTER — Inpatient Hospital Stay (HOSPITAL_COMMUNITY): Payer: Medicaid Other

## 2021-10-06 ENCOUNTER — Encounter (HOSPITAL_COMMUNITY): Payer: Self-pay | Admitting: Obstetrics & Gynecology

## 2021-10-06 DIAGNOSIS — O209 Hemorrhage in early pregnancy, unspecified: Secondary | ICD-10-CM | POA: Insufficient documentation

## 2021-10-06 DIAGNOSIS — O3680X0 Pregnancy with inconclusive fetal viability, not applicable or unspecified: Secondary | ICD-10-CM | POA: Diagnosis not present

## 2021-10-06 DIAGNOSIS — Z3490 Encounter for supervision of normal pregnancy, unspecified, unspecified trimester: Secondary | ICD-10-CM

## 2021-10-06 DIAGNOSIS — Z3A09 9 weeks gestation of pregnancy: Secondary | ICD-10-CM

## 2021-10-06 DIAGNOSIS — Z7951 Long term (current) use of inhaled steroids: Secondary | ICD-10-CM | POA: Insufficient documentation

## 2021-10-06 DIAGNOSIS — Z79899 Other long term (current) drug therapy: Secondary | ICD-10-CM | POA: Diagnosis not present

## 2021-10-06 DIAGNOSIS — Z6790 Unspecified blood type, Rh positive: Secondary | ICD-10-CM

## 2021-10-06 DIAGNOSIS — O4691 Antepartum hemorrhage, unspecified, first trimester: Secondary | ICD-10-CM | POA: Diagnosis not present

## 2021-10-06 DIAGNOSIS — O4690 Antepartum hemorrhage, unspecified, unspecified trimester: Secondary | ICD-10-CM

## 2021-10-06 LAB — CBC
HCT: 37.5 % (ref 36.0–46.0)
Hemoglobin: 12.3 g/dL (ref 12.0–15.0)
MCH: 27.4 pg (ref 26.0–34.0)
MCHC: 32.8 g/dL (ref 30.0–36.0)
MCV: 83.5 fL (ref 80.0–100.0)
Platelets: 282 10*3/uL (ref 150–400)
RBC: 4.49 MIL/uL (ref 3.87–5.11)
RDW: 13.7 % (ref 11.5–15.5)
WBC: 8.6 10*3/uL (ref 4.0–10.5)
nRBC: 0 % (ref 0.0–0.2)

## 2021-10-06 LAB — URINALYSIS, ROUTINE W REFLEX MICROSCOPIC
Bacteria, UA: NONE SEEN
Bilirubin Urine: NEGATIVE
Glucose, UA: NEGATIVE mg/dL
Ketones, ur: NEGATIVE mg/dL
Leukocytes,Ua: NEGATIVE
Nitrite: NEGATIVE
Protein, ur: NEGATIVE mg/dL
Specific Gravity, Urine: 1.009 (ref 1.005–1.030)
pH: 5 (ref 5.0–8.0)

## 2021-10-06 LAB — POCT PREGNANCY, URINE: Preg Test, Ur: POSITIVE — AB

## 2021-10-06 LAB — HCG, QUANTITATIVE, PREGNANCY: hCG, Beta Chain, Quant, S: 10613 m[IU]/mL — ABNORMAL HIGH (ref <5)

## 2021-10-06 LAB — WET PREP, GENITAL
Clue Cells Wet Prep HPF POC: NONE SEEN
Sperm: NONE SEEN
Trich, Wet Prep: NONE SEEN
Yeast Wet Prep HPF POC: NONE SEEN

## 2021-10-06 LAB — ABO/RH: ABO/RH(D): O POS

## 2021-10-06 NOTE — Discharge Instructions (Signed)

## 2021-10-06 NOTE — MAU Provider Note (Signed)
History     CSN: [id number]  Arrival date and time: 10/06/21 2033   Event Date/Time   First Provider Initiated Contact with Patient 10/06/21 2216      Chief Complaint  Patient presents with   Vaginal Bleeding   20 y.o. G2P0010 @[redacted]w[redacted]d  by sure LMP presenting with abdominal cramping and spotting. Hx of irregular menses, usually skips 1-2 months. Cramping has been ongoing but became more severe today. Denies urinary sx. Spotting started today. Was brown initially then became more pin. Only notices when she wipes. Has not needed a pad or liner. Endorses feeling lightheaded. No syncope. Is driking well but has not eaten since 11 am.    OB History     Gravida  2   Para      Term      Preterm      AB  1   Living         SAB  1   IAB      Ectopic      Multiple      Live Births              Past Medical History:  Diagnosis Date   Asthma    Insulin resistance    and oligomenorrhea    Past Surgical History:  Procedure Laterality Date   NO PAST SURGERIES      Family History  Problem Relation Age of Onset   Hypertension Mother    Hypertension Maternal Grandmother    Allergic rhinitis Neg Hx    Angioedema Neg Hx    Asthma Neg Hx    Eczema Neg Hx    Immunodeficiency Neg Hx    Urticaria Neg Hx     Social History   Tobacco Use   Smoking status: Never   Smokeless tobacco: Never  Vaping Use   Vaping Use: Never used  Substance Use Topics   Alcohol use: Not Currently    Alcohol/week: 0.0 standard drinks   Drug use: No    Allergies: No Known Allergies  Medications Prior to Admission  Medication Sig Dispense Refill Last Dose   Prenatal Vit-Fe Fumarate-FA (PREPLUS) 27-1 MG TABS Take 1 tablet by mouth daily.      albuterol (VENTOLIN HFA) 108 (90 Base) MCG/ACT inhaler Inhale 2 puffs into the lungs every 4 (four) hours as needed for wheezing or shortness of breath (coughing fits). 18 g 1 Unknown   budesonide-formoterol (SYMBICORT) 160-4.5 MCG/ACT  inhaler Inhale 2 puffs into the lungs in the morning and at bedtime. with spacer and rinse mouth afterwards. 1 each 5 Unknown   diclofenac (VOLTAREN) 75 MG EC tablet Take 1 tablet (75 mg total) by mouth 2 (two) times daily. 20 tablet 0 Unknown   famotidine (PEPCID) 20 MG tablet Take 1 tablet (20 mg total) by mouth 2 (two) times daily. 60 tablet 5 Unknown   fluticasone (FLONASE) 50 MCG/ACT nasal spray Place 1 spray into both nostrils 2 (two) times daily as needed for allergies or rhinitis. 16 g 5 Unknown   methocarbamol (ROBAXIN) 500 MG tablet Take 1 tablet (500 mg total) by mouth 4 (four) times daily. 20 tablet 0 Unknown   montelukast (SINGULAIR) 10 MG tablet Take 1 tablet (10 mg total) by mouth at bedtime. 30 tablet 5 Unknown    Review of Systems  Gastrointestinal:  Positive for abdominal pain.  Genitourinary:  Positive for vaginal bleeding. Negative for dysuria, frequency, hematuria and urgency.  Neurological:  Positive for light-headedness. Negative for syncope.  Physical Exam   Blood pressure 115/68, pulse 90, temperature 98.9 F (37.2 C), temperature source Oral, resp. rate 18, height 5\' 1"  (1.549 m), weight 78.9 kg, last menstrual period 07/30/2021, unknown if currently breastfeeding.  Physical Exam Vitals and nursing note reviewed.  Constitutional:      General: She is not in acute distress.    Appearance: Normal appearance.  HENT:     Head: Normocephalic.  Cardiovascular:     Rate and Rhythm: Normal rate.  Pulmonary:     Effort: Pulmonary effort is normal. No respiratory distress.  Abdominal:     General: There is no distension.     Palpations: Abdomen is soft. There is no mass.     Tenderness: There is no abdominal tenderness. There is no guarding or rebound.     Hernia: No hernia is present.  Musculoskeletal:        General: Normal range of motion.     Cervical back: Normal range of motion.  Skin:    General: Skin is warm and dry.  Neurological:     General: No focal  deficit present.     Mental Status: She is alert and oriented to person, place, and time.  Psychiatric:        Mood and Affect: Mood normal.        Behavior: Behavior normal.   Results for orders placed or performed during the hospital encounter of 10/06/21 (from the past 24 hour(s))  Pregnancy, urine POC     Status: Abnormal   Collection Time: 10/06/21  8:52 PM  Result Value Ref Range   Preg Test, Ur POSITIVE (A) NEGATIVE  Urinalysis, Routine w reflex microscopic Urine, Clean Catch     Status: Abnormal   Collection Time: 10/06/21  9:30 PM  Result Value Ref Range   Color, Urine STRAW (A) YELLOW   APPearance CLEAR CLEAR   Specific Gravity, Urine 1.009 1.005 - 1.030   pH 5.0 5.0 - 8.0   Glucose, UA NEGATIVE NEGATIVE mg/dL   Hgb urine dipstick SMALL (A) NEGATIVE   Bilirubin Urine NEGATIVE NEGATIVE   Ketones, ur NEGATIVE NEGATIVE mg/dL   Protein, ur NEGATIVE NEGATIVE mg/dL   Nitrite NEGATIVE NEGATIVE   Leukocytes,Ua NEGATIVE NEGATIVE   WBC, UA 0-5 0 - 5 WBC/hpf   Bacteria, UA NONE SEEN NONE SEEN   Squamous Epithelial / LPF 0-5 0 - 5   Mucus PRESENT   ABO/Rh     Status: None   Collection Time: 10/06/21 10:09 PM  Result Value Ref Range   ABO/RH(D) O POS    No rh immune globuloin      NOT A RH IMMUNE GLOBULIN CANDIDATE, PT RH POSITIVE Performed at [facility], [address] [place], [place]   CBC     Status: None   Collection Time: 10/06/21 10:09 PM  Result Value Ref Range   WBC 8.6 4.0 - 10.5 K/uL   RBC 4.49 3.87 - 5.11 MIL/uL   Hemoglobin 12.3 12.0 - 15.0 g/dL   HCT 10/08/21 58.0 - 99.8 %   MCV 83.5 80.0 - 100.0 fL   MCH 27.4 26.0 - 34.0 pg   MCHC 32.8 30.0 - 36.0 g/dL   RDW 33.8 25.0 - 53.9 %   Platelets 282 150 - 400 K/uL   nRBC 0.0 0.0 - 0.2 %  hCG, quantitative, pregnancy     Status: Abnormal   Collection Time: 10/06/21 10:09 PM  Result Value Ref Range   hCG,  Beta Chain, Quant, S 10,613 (H) <5 mIU/mL  Wet prep, genital     Status: Abnormal    Collection Time: 10/06/21 10:45 PM   Specimen: PATH Cytology Cervicovaginal Ancillary Only  Result Value Ref Range   Yeast Wet Prep HPF POC NONE SEEN NONE SEEN   Trich, Wet Prep NONE SEEN NONE SEEN   Clue Cells Wet Prep HPF POC NONE SEEN NONE SEEN   WBC, Wet Prep HPF POC MANY (A) NONE SEEN   Sperm NONE SEEN    US OB LESS THAN 14 WEEKS WITH OB TRANSVAGINAL  Result Date: 10/06/2021 CLINICAL DATA:  Pelvic pain and bleeding, positive pregnancy test EXAM: OBSTETRIC <14 WK [place] AND TRANSVAGINAL OB US TECHNIQUE: Both transabdominal and transvaginal ultrasound examinations were performed for complete evaluation of the gestation as well as the maternal uterus, adnexal regions, and pelvic cul-de-sac. Transvaginal technique was performed to assess early pregnancy. COMPARISON:  08/22/2021 FINDINGS: Intrauterine gestational sac: Present Yolk sac:  Present Embryo:  Absent MSD: 8.9 mm   5 w   4 d Subchorionic hemorrhage:  None visualized. Maternal uterus/adnexae: Ovaries are within normal limits. Uterus is otherwise within normal limits. IMPRESSION: Probable early intrauterine gestational sac with yolk sac but no fetal pole, or cardiac activity yet visualized. Recommend follow-up quantitative B-HCG levels and follow-up US in 14 days to assess viability. This recommendation follows SRU consensus guidelines: Diagnostic Criteria for Nonviable Pregnancy Early in the First Trimester. Malva Limes Med 2013; 381:8299-37. Electronically Signed   By: Alcide Clever M.D.   On: 10/06/2021 23:07    MAU Course  Procedures  MDM Labs and [place] ordered and reviewed. Early IUP on [place] without FP seen. Discussed findings with pt and partner. Has [place] scheduled tomorrow, will need to reschedule in 7-10 days for viability. Informed needs to eat and drink often. Stable for discharge home.   Assessment and Plan   1. Early stage of pregnancy   2. Vaginal bleeding in pregnancy   3. Blood type, Rh positive   4. Pregnancy with inconclusive fetal  viability, single or unspecified fetus    Discharge home Follow up at [facility] in 1 week SAB precautions Pelvic rest Tylenol prn  Allergies as of 10/06/2021   No Known Allergies      Medication List     STOP taking these medications    diclofenac 75 MG EC tablet Commonly known as: VOLTAREN   methocarbamol 500 MG tablet Commonly known as: Robaxin       TAKE these medications    albuterol 108 (90 Base) MCG/ACT inhaler Commonly known as: Ventolin HFA Inhale 2 puffs into the lungs every 4 (four) hours as needed for wheezing or shortness of breath (coughing fits).   budesonide-formoterol 160-4.5 MCG/ACT inhaler Commonly known as: Symbicort Inhale 2 puffs into the lungs in the morning and at bedtime. with spacer and rinse mouth afterwards.   famotidine 20 MG tablet Commonly known as: PEPCID Take 1 tablet (20 mg total) by mouth 2 (two) times daily.   fluticasone 50 MCG/ACT nasal spray Commonly known as: Flonase Place 1 spray into both nostrils 2 (two) times daily as needed for allergies or rhinitis.   montelukast 10 MG tablet Commonly known as: Singulair Take 1 tablet (10 mg total) by mouth at bedtime.   PrePLUS 27-1 MG Tabs Take 1 tablet by mouth daily.       Donette Larry, CNM 10/06/2021, 11:53 PM

## 2021-10-06 NOTE — MAU Note (Signed)
Pt presents to MAU with c/o vaginal bleeding that started about an hour ago.  Pt is not wearing a pad currently and denies recent intercourse.  Pt states that she had a miscarriage and just wanted to make sure that everything is okay.

## 2021-10-07 LAB — GC/CHLAMYDIA PROBE AMP (~~LOC~~) NOT AT ARMC
Chlamydia: NEGATIVE
Comment: NEGATIVE
Comment: NORMAL
Neisseria Gonorrhea: NEGATIVE

## 2021-11-22 ENCOUNTER — Inpatient Hospital Stay (HOSPITAL_COMMUNITY)
Admission: AD | Admit: 2021-11-22 | Discharge: 2021-11-23 | Disposition: A | Discharge status: Home / Self Care | Payer: Medicaid Other | Attending: Obstetrics | Admitting: Obstetrics

## 2021-11-22 ENCOUNTER — Other Ambulatory Visit: Payer: Self-pay

## 2021-11-22 DIAGNOSIS — O26891 Other specified pregnancy related conditions, first trimester: Secondary | ICD-10-CM | POA: Insufficient documentation

## 2021-11-22 DIAGNOSIS — O219 Vomiting of pregnancy, unspecified: Secondary | ICD-10-CM

## 2021-11-22 DIAGNOSIS — R102 Pelvic and perineal pain: Secondary | ICD-10-CM

## 2021-11-22 DIAGNOSIS — Z3A12 12 weeks gestation of pregnancy: Secondary | ICD-10-CM

## 2021-11-22 DIAGNOSIS — O26899 Other specified pregnancy related conditions, unspecified trimester: Secondary | ICD-10-CM

## 2021-11-22 DIAGNOSIS — R300 Dysuria: Secondary | ICD-10-CM | POA: Insufficient documentation

## 2021-11-22 NOTE — MAU Note (Signed)
..  Peggy [name] is a 20 y.o.  here in MAU reporting: shooting vaginal pain. Had a UTI, got treated but the pelvic pain is still there, it hurts to pee and hurts to walk. Using the bathroom frequently.  Denies vaginal bleeding.  Pain score: shooting pain 9/10. Pelvic pain 7/10 Vitals:   11/22/21 2356  BP: 115/67  Pulse: 80  Resp: 17  Temp: 98.8 F (37.1 C)  SpO2: 100%     FHT:155 Lab orders placed from triage: UA  Had an ultrasound in the office and had a due date of 06/06/2022. Making her [redacted]w[redacted]d.

## 2021-11-23 DIAGNOSIS — O219 Vomiting of pregnancy, unspecified: Secondary | ICD-10-CM

## 2021-11-23 DIAGNOSIS — O26891 Other specified pregnancy related conditions, first trimester: Secondary | ICD-10-CM | POA: Diagnosis not present

## 2021-11-23 DIAGNOSIS — O26899 Other specified pregnancy related conditions, unspecified trimester: Secondary | ICD-10-CM | POA: Diagnosis not present

## 2021-11-23 DIAGNOSIS — R102 Pelvic and perineal pain: Secondary | ICD-10-CM

## 2021-11-23 DIAGNOSIS — Z3A12 12 weeks gestation of pregnancy: Secondary | ICD-10-CM

## 2021-11-23 DIAGNOSIS — R300 Dysuria: Secondary | ICD-10-CM | POA: Diagnosis not present

## 2021-11-23 LAB — URINALYSIS, ROUTINE W REFLEX MICROSCOPIC
Bilirubin Urine: NEGATIVE
Glucose, UA: NEGATIVE mg/dL
Hgb urine dipstick: NEGATIVE
Ketones, ur: NEGATIVE mg/dL
Nitrite: NEGATIVE
Protein, ur: NEGATIVE mg/dL
Specific Gravity, Urine: 1.021 (ref 1.005–1.030)
pH: 5 (ref 5.0–8.0)

## 2021-11-23 LAB — WET PREP, GENITAL
Clue Cells Wet Prep HPF POC: NONE SEEN
Sperm: NONE SEEN
Trich, Wet Prep: NONE SEEN
WBC, Wet Prep HPF POC: >=10 — AB (ref <10)
Yeast Wet Prep HPF POC: NONE SEEN

## 2021-11-23 MED ORDER — ONDANSETRON 4 MG PO TBDP: 4.0000 mg | ORAL_TABLET | Freq: Four times a day (QID) | ORAL | 1 refills | Status: DC | PRN

## 2021-11-23 MED ORDER — ONDANSETRON 4 MG PO TBDP
4.0000 mg | ORAL_TABLET | Freq: Once | ORAL | Status: AC
  Administered 2021-11-23: 4 mg via ORAL
  Filled 2021-11-23: qty 1

## 2021-11-23 NOTE — MAU Provider Note (Signed)
History     CSN: [id number]  Arrival date and time: 11/22/21 2323   Event Date/Time   First Provider Initiated Contact with Patient 11/23/21 0315      Chief Complaint  Patient presents with   Pelvic Pain   Peggy [name] is a 20 y.o. G2P0010 at [redacted]w[redacted]d who receives care at [facility].  She presents today for Dysuria.  She states she was treated for a UTI ~ 3 weeks ago, but she continues to have "a shooting pain" in her vaginal area.  She states that the pain is constant and is further aggravated with walking.  She reports taking tylenol earlier today, but had no relief. She denies vaginal discharge or bleeding.  She states she reported the pain to her primary ob and was instructed to increase water intake and pain should subside. Patient  also states that she has a "pulsating pain" that lasts 5-10 seconds and occurs with certain movements.  She states she has been experiencing vomiting, but was told to take Unisom.  She reports she has not been taking this because it makes her tired and now she eats after she throws up in order to not not lose weight.    OB History     Gravida  2   Para      Term      Preterm      AB  1   Living         SAB  1   IAB      Ectopic      Multiple      Live Births              Past Medical History:  Diagnosis Date   Asthma    Insulin resistance    and oligomenorrhea    Past Surgical History:  Procedure Laterality Date   NO PAST SURGERIES      Family History  Problem Relation Age of Onset   Hypertension Mother    Hypertension Maternal Grandmother    Allergic rhinitis Neg Hx    Angioedema Neg Hx    Asthma Neg Hx    Eczema Neg Hx    Immunodeficiency Neg Hx    Urticaria Neg Hx     Social History   Tobacco Use   Smoking status: Never   Smokeless tobacco: Never  Vaping Use   Vaping Use: Never used  Substance Use Topics   Alcohol use: Not Currently    Alcohol/week: 0.0 standard drinks   Drug  use: No    Allergies: No Known Allergies  Medications Prior to Admission  Medication Sig Dispense Refill Last Dose   albuterol (VENTOLIN HFA) 108 (90 Base) MCG/ACT inhaler Inhale 2 puffs into the lungs every 4 (four) hours as needed for wheezing or shortness of breath (coughing fits). 18 g 1    budesonide-formoterol (SYMBICORT) 160-4.5 MCG/ACT inhaler Inhale 2 puffs into the lungs in the morning and at bedtime. with spacer and rinse mouth afterwards. 1 each 5    famotidine (PEPCID) 20 MG tablet Take 1 tablet (20 mg total) by mouth 2 (two) times daily. 60 tablet 5    fluticasone (FLONASE) 50 MCG/ACT nasal spray Place 1 spray into both nostrils 2 (two) times daily as needed for allergies or rhinitis. 16 g 5    montelukast (SINGULAIR) 10 MG tablet Take 1 tablet (10 mg total) by mouth at bedtime. 30 tablet 5    Prenatal Vit-Fe Fumarate-FA (PREPLUS) 27-1 MG  TABS Take 1 tablet by mouth daily.       Review of Systems  Gastrointestinal:  Positive for constipation (yesterday morning-no difficulty), nausea and vomiting. Negative for abdominal pain and diarrhea.  Genitourinary:  Positive for dysuria, pelvic pain and vaginal pain. Negative for difficulty urinating, vaginal bleeding and vaginal discharge.  Neurological:  Negative for dizziness, light-headedness and headaches.  Physical Exam   Blood pressure 115/67, pulse 80, temperature 98.8 F (37.1 C), temperature source Oral, resp. rate 17, height 5\' 1"  (1.549 m), weight 74.8 kg, last menstrual period 07/30/2021, SpO2 100 %, unknown if currently breastfeeding.  Physical Exam Vitals reviewed.  Constitutional:      Appearance: Normal appearance.  HENT:     Head: Normocephalic and atraumatic.  Eyes:     Conjunctiva/sclera: Conjunctivae normal.  Cardiovascular:     Rate and Rhythm: Normal rate.     Heart sounds: Normal heart sounds.  Pulmonary:     Effort: Pulmonary effort is normal. No respiratory distress.     Breath sounds: Normal breath  sounds.  Abdominal:     Palpations: Abdomen is soft.     Tenderness: There is no abdominal tenderness.  Musculoskeletal:        General: Normal range of motion.     Cervical back: Normal range of motion.     Right lower leg: No edema.     Left lower leg: No edema.  Skin:    General: Skin is warm and dry.  Neurological:     Mental Status: She is alert and oriented to person, place, and time.  Psychiatric:        Mood and Affect: Mood normal.        Behavior: Behavior normal.        Thought Content: Thought content normal.    MAU Course  Procedures Results for orders placed or performed during the hospital encounter of 11/22/21 (from the past 24 hour(s))  Urinalysis, Routine w reflex microscopic Urine, Clean Catch     Status: Abnormal   Collection Time: 11/23/21 12:10 AM  Result Value Ref Range   Color, Urine YELLOW YELLOW   APPearance HAZY (A) CLEAR   Specific Gravity, Urine 1.021 1.005 - 1.030   pH 5.0 5.0 - 8.0   Glucose, UA NEGATIVE NEGATIVE mg/dL   Hgb urine dipstick NEGATIVE NEGATIVE   Bilirubin Urine NEGATIVE NEGATIVE   Ketones, ur NEGATIVE NEGATIVE mg/dL   Protein, ur NEGATIVE NEGATIVE mg/dL   Nitrite NEGATIVE NEGATIVE   Leukocytes,Ua MODERATE (A) NEGATIVE   RBC / HPF 0-5 0 - 5 RBC/hpf   WBC, UA 21-50 0 - 5 WBC/hpf   Bacteria, UA RARE (A) NONE SEEN   Squamous Epithelial / LPF 0-5 0 - 5   Mucus PRESENT    Ca Oxalate Crys, UA PRESENT   Wet prep, genital     Status: Abnormal   Collection Time: 11/23/21  2:33 AM  Result Value Ref Range   Yeast Wet Prep HPF POC NONE SEEN NONE SEEN   Trich, Wet Prep NONE SEEN NONE SEEN   Clue Cells Wet Prep HPF POC NONE SEEN NONE SEEN   WBC, Wet Prep HPF POC >=10 (A) <10   Sperm NONE SEEN     MDM Physical Exam Cultures: Wet Prep, GC/CT Antiemetic Labs: UA, UC Assessment and Plan  20 year old  G2P0010 at 12.2 weeks Nausea Round Ligament Pain Vaginal Pain  -Cultures collected by self swab while patient waiting. -Results  reviewed -Reassured that no  significant bacteria noted, but will send for culture. -Educated on round ligament pain and how physiological changes contribute to this discomfort.  -Discussed usage of Unisom in conjunction with B6 for management of nausea.  -Reviewed usage of Zofran for breakthrough nausea during the day. Cautioned regarding previous research suggesting link between usage and cleft lip/palpate in neonates during early pregnancy usage.   -Will give Zofran 4mg  now and patient desires script. -Reassured that not necessary to try to eat after vomiting.  Discussed fetal nutrition and how some weight can be lost without significant effects on fetus. -Reviewed c/o vaginal pain.  Informed that sounds similar to "lightening crotch."  Informed that usually occurs in late pregnancy, but is ultimately from pressure on vaginal nerves.  -Patient verbalizes understanding regarding above education/discussion. -Will give information in AVS. -Encouraged to follow up with primary ob and given list of community providers as patient expressing desire to change providers. -Precautions Given. -Encouraged to call primary office or return to MAU if symptoms worsen or with the onset of new symptoms. -Discharged to home in stable condition.  11/23/2021, 3:15 AM

## 2021-11-23 NOTE — Discharge Instructions (Signed)
[facility] [employer] for [employer] at [facility]  [address] [place], [place] [address]  [phone]  Center for [facility] at [facility]  [address], [place], [place] [address]  [phone]  Center for [facility] at [facility] [address], [place], [place] [address]  [phone]  Center for [facility] Healthcare at [facility]  [address] [place], [place] [address]  [phone]  Center for [facility] at [facility]  [address]First floor), [place], [place] [address]  [phone]  Center for [facility] [address], [place], [place] [address] [phone]  Center for [facility] at [facility]  [address] [place], [place], [place] [address]  [phone]  [facility]  [address], [place], [place] [address]  [phone]  [facility]  [address] [place], [place], [place] [address]  [phone]  [facility]  [address] [place], [place] [address]  [phone]  [facility]  [address] [place], [place] [address]  [phone]  [facility]  [address], [place], [place] [address]  [phone]  [facility]   [address] [place], [place] [address]  [phone]  [facility]  [address], [place], [place] [address]   [phone]  [facility]  [address] [place], [place] [address]  [phone]        PREGNANCY SUPPORT BELT: You are not alone, Seventy-five percent of women have some sort of abdominal or back pain at some point in their pregnancy. Your baby is growing at a fast pace, which means that your whole body is rapidly trying to adjust to the changes. As your uterus grows, your back may start feeling a bit under stress and this can result in back or abdominal pain that can go from mild, and therefore bearable, to  severe pains that will not allow you to sit or lay down comfortably, When it comes to dealing with pregnancy-related pains and cramps, some pregnant women usually prefer natural remedies, which the market is filled with nowadays. For example, wearing a pregnancy support belt can help ease and lessen your discomfort and pain. WHAT ARE THE BENEFITS OF WEARING A PREGNANCY SUPPORT BELT? A pregnancy support belt provides support to the lower portion of the belly taking some of the weight of the growing uterus and distributing to the other parts of your body. It is designed make you comfortable and gives you extra support. Over the years, the pregnancy apparel market has been studying the needs and wants of pregnant women and they have come up with the most comfortable pregnancy support belts that woman could ever ask for. In fact, you will no longer have to wear a stretched-out or bulky pregnancy belt that is visible underneath your clothes and makes you feel even more uncomfortable. Nowadays, a pregnancy support belt is made of comfortable and stretchy materials that will not irritate your skin but will actually make you feel at ease and you will not even notice you are wearing it. They are easy to put on and adjust during the day and can be worn at night for additional support.  BENEFITS: Relives Back pain Relieves Abdominal Muscle and Leg Pain Stabilizes the Pelvic Ring Offers a Cushioned Abdominal Lift Pad Relieves pressure on the Sciatic Nerve Within Minutes WHERE TO GET YOUR  PREGNANCY BELT: [employer], [employer], [employer], OR OTHER ONLINE RETAILERS.

## 2021-11-24 LAB — CULTURE, OB URINE

## 2021-11-25 LAB — GC/CHLAMYDIA PROBE AMP (~~LOC~~) NOT AT ARMC
Bacterial Vaginitis (gardnerella): NEGATIVE
Candida Glabrata: NEGATIVE
Candida Vaginitis: NEGATIVE
Chlamydia: NEGATIVE
Comment: NEGATIVE
Comment: NEGATIVE
Comment: NEGATIVE
Comment: NEGATIVE
Comment: NEGATIVE
Comment: NORMAL
Neisseria Gonorrhea: NEGATIVE
Trichomonas: NEGATIVE

## 2021-12-23 ENCOUNTER — Inpatient Hospital Stay (HOSPITAL_COMMUNITY)
Admission: RE | Admit: 2021-12-23 | Discharge: 2021-12-23 | Disposition: A | Discharge status: Home / Self Care | Payer: Medicaid Other | Source: Ambulatory Visit | Attending: Obstetrics and Gynecology | Admitting: Obstetrics and Gynecology

## 2021-12-23 ENCOUNTER — Inpatient Hospital Stay (HOSPITAL_BASED_OUTPATIENT_CLINIC_OR_DEPARTMENT_OTHER): Payer: Medicaid Other

## 2021-12-23 ENCOUNTER — Encounter (HOSPITAL_COMMUNITY): Payer: Self-pay | Admitting: Obstetrics and Gynecology

## 2021-12-23 ENCOUNTER — Other Ambulatory Visit: Payer: Self-pay

## 2021-12-23 DIAGNOSIS — R1010 Upper abdominal pain, unspecified: Secondary | ICD-10-CM | POA: Diagnosis not present

## 2021-12-23 DIAGNOSIS — Z3A16 16 weeks gestation of pregnancy: Secondary | ICD-10-CM | POA: Diagnosis not present

## 2021-12-23 DIAGNOSIS — R109 Unspecified abdominal pain: Secondary | ICD-10-CM

## 2021-12-23 DIAGNOSIS — O26892 Other specified pregnancy related conditions, second trimester: Secondary | ICD-10-CM

## 2021-12-23 LAB — URINALYSIS, ROUTINE W REFLEX MICROSCOPIC
Bilirubin Urine: NEGATIVE
Glucose, UA: NEGATIVE mg/dL
Hgb urine dipstick: NEGATIVE
Ketones, ur: NEGATIVE mg/dL
Nitrite: NEGATIVE
Protein, ur: NEGATIVE mg/dL
Specific Gravity, Urine: 1.019 (ref 1.005–1.030)
pH: 5 (ref 5.0–8.0)

## 2021-12-23 LAB — CBC WITH DIFFERENTIAL/PLATELET
Abs Immature Granulocytes: 0.01 K/uL (ref 0.00–0.07)
Basophils Absolute: 0 K/uL (ref 0.0–0.1)
Basophils Relative: 0 %
Eosinophils Absolute: 0.1 K/uL (ref 0.0–0.5)
Eosinophils Relative: 1 %
HCT: 32.6 % — ABNORMAL LOW (ref 36.0–46.0)
Hemoglobin: 10.6 g/dL — ABNORMAL LOW (ref 12.0–15.0)
Immature Granulocytes: 0 %
Lymphocytes Relative: 30 %
Lymphs Abs: 1.4 K/uL (ref 0.7–4.0)
MCH: 27.8 pg (ref 26.0–34.0)
MCHC: 32.5 g/dL (ref 30.0–36.0)
MCV: 85.6 fL (ref 80.0–100.0)
Monocytes Absolute: 0.3 K/uL (ref 0.1–1.0)
Monocytes Relative: 7 %
Neutro Abs: 3 K/uL (ref 1.7–7.7)
Neutrophils Relative %: 62 %
Platelets: 257 K/uL (ref 150–400)
RBC: 3.81 MIL/uL — ABNORMAL LOW (ref 3.87–5.11)
RDW: 14.3 % (ref 11.5–15.5)
WBC: 4.8 K/uL (ref 4.0–10.5)
nRBC: 0 % (ref 0.0–0.2)

## 2021-12-23 LAB — WET PREP, GENITAL
Clue Cells Wet Prep HPF POC: NONE SEEN
Sperm: NONE SEEN
Trich, Wet Prep: NONE SEEN
WBC, Wet Prep HPF POC: <10 (ref <10)
Yeast Wet Prep HPF POC: NONE SEEN

## 2021-12-23 LAB — COMPREHENSIVE METABOLIC PANEL
ALT: 32 U/L (ref 0–44)
AST: 25 U/L (ref 15–41)
Albumin: 3 g/dL — ABNORMAL LOW (ref 3.5–5.0)
Alkaline Phosphatase: 46 U/L (ref 38–126)
Anion gap: 7 (ref 5–15)
BUN: <5 mg/dL — ABNORMAL LOW (ref 6–20)
CO2: 20 mmol/L — ABNORMAL LOW (ref 22–32)
Calcium: 8.8 mg/dL — ABNORMAL LOW (ref 8.9–10.3)
Chloride: 109 mmol/L (ref 98–111)
Creatinine, Ser: 0.35 mg/dL — ABNORMAL LOW (ref 0.44–1.00)
GFR, Estimated: >60 mL/min (ref >60)
Glucose, Bld: 87 mg/dL (ref 70–99)
Potassium: 3.6 mmol/L (ref 3.5–5.1)
Sodium: 136 mmol/L (ref 135–145)
Total Bilirubin: 0.7 mg/dL (ref 0.3–1.2)
Total Protein: 6.3 g/dL — ABNORMAL LOW (ref 6.5–8.1)

## 2021-12-23 MED ORDER — KETOROLAC TROMETHAMINE 60 MG/2ML IM SOLN
60.0000 mg | Freq: Once | INTRAMUSCULAR | Status: AC
  Administered 2021-12-23: 60 mg via INTRAMUSCULAR
  Filled 2021-12-23: qty 2

## 2021-12-23 NOTE — MAU Provider Note (Addendum)
History     CSN: [id number]  Arrival date and time: 12/23/21 1108   Event Date/Time   First Provider Initiated Contact with Patient 12/23/21 1209      Chief Complaint  Patient presents with   Abdominal Pain   HPI  Ms.Marland KitchenMarland KitchenAlonnie Trueman [name] is a 21yo female G2P0010 @ [redacted]w[redacted]d here with upper abdominal pain that started today when she woke up. The pain comes and goes. This is a new pain. She ate a spicy biscuit last night and does not normally eat things like this. She has not had anything to eat this morning. She has not tried anything for the pain. She has no nausea or vomiting. No vaginal bleeding.   OB History     Gravida  2   Para      Term      Preterm      AB  1   Living         SAB  1   IAB      Ectopic      Multiple      Live Births              Past Medical History:  Diagnosis Date   Asthma    Insulin resistance    and oligomenorrhea    Past Surgical History:  Procedure Laterality Date   NO PAST SURGERIES      Family History  Problem Relation Age of Onset   Hypertension Mother    Hypertension Maternal Grandmother    Allergic rhinitis Neg Hx    Angioedema Neg Hx    Asthma Neg Hx    Eczema Neg Hx    Immunodeficiency Neg Hx    Urticaria Neg Hx     Social History   Tobacco Use   Smoking status: Never   Smokeless tobacco: Never  Vaping Use   Vaping Use: Never used  Substance Use Topics   Alcohol use: Not Currently    Alcohol/week: 0.0 standard drinks   Drug use: No    Allergies: No Known Allergies  Medications Prior to Admission  Medication Sig Dispense Refill Last Dose   albuterol (VENTOLIN HFA) 108 (90 Base) MCG/ACT inhaler Inhale 2 puffs into the lungs every 4 (four) hours as needed for wheezing or shortness of breath (coughing fits). 18 g 1    budesonide-formoterol (SYMBICORT) 160-4.5 MCG/ACT inhaler Inhale 2 puffs into the lungs in the morning and at bedtime. with spacer and rinse mouth afterwards. 1 each 5     famotidine (PEPCID) 20 MG tablet Take 1 tablet (20 mg total) by mouth 2 (two) times daily. 60 tablet 5    fluticasone (FLONASE) 50 MCG/ACT nasal spray Place 1 spray into both nostrils 2 (two) times daily as needed for allergies or rhinitis. 16 g 5    montelukast (SINGULAIR) 10 MG tablet Take 1 tablet (10 mg total) by mouth at bedtime. 30 tablet 5    ondansetron (ZOFRAN-ODT) 4 MG disintegrating tablet Take 1-2 tablets (4-8 mg total) by mouth every 6 (six) hours as needed for nausea or vomiting. 30 tablet 1    Prenatal Vit-Fe Fumarate-FA (PREPLUS) 27-1 MG TABS Take 1 tablet by mouth daily.      No results found for this or any previous visit (from the past 48 hour(s)).   US OB LESS THAN 14 WEEKS WITH OB TRANSVAGINAL  Result Date: 10/06/2021 CLINICAL DATA:  Pelvic pain and bleeding, positive pregnancy test EXAM: OBSTETRIC <14 WK [place] AND TRANSVAGINAL  OB US TECHNIQUE: Both transabdominal and transvaginal ultrasound examinations were performed for complete evaluation of the gestation as well as the maternal uterus, adnexal regions, and pelvic cul-de-sac. Transvaginal technique was performed to assess early pregnancy. COMPARISON:  08/22/2021 FINDINGS: Intrauterine gestational sac: Present Yolk sac:  Present Embryo:  Absent MSD: 8.9 mm   5 w   4 [name] Subchorionic hemorrhage:  None visualized. Maternal uterus/adnexae: Ovaries are within normal limits. Uterus is otherwise within normal limits. IMPRESSION: Probable early intrauterine gestational sac with yolk sac but no fetal pole, or cardiac activity yet visualized. Recommend follow-up quantitative B-HCG levels and follow-up US in 14 days to assess viability. This recommendation follows SRU consensus guidelines: Diagnostic Criteria for Nonviable Pregnancy Early in the First Trimester. [place] [place] Med [id number]. Electronically Signed   By: Inez Catalina M.[name].   On: 10/06/2021 23:07   [place] MFM OB LIMITED  Result Date:  12/23/2021 ----------------------------------------------------------------------  OBSTETRICS REPORT                       (Signed Final 12/23/2021 02:42 pm) ---------------------------------------------------------------------- Patient Info  ID #:       [id number]                          [name].O.B.:  2001-04-09 (20 yrs)  Name:       Peggy [name]                    Visit Date: 12/23/2021 12:30 pm              [name] ---------------------------------------------------------------------- Performed By  Attending:        Johnell Comings MD         Referred By:       [facility] MAU/Triage  Performed By:     Valda Favia          [facility] ---------------------------------------------------------------------- Orders  #  Description                           Code        Ordered By  1  [place] MFM OB LIMITED                     [id number]    Peggy [name] ----------------------------------------------------------------------  #  Order #                     Accession #                Episode #  1  [id number]                   [id number]                 [id number] ---------------------------------------------------------------------- Indications  Vaginal bleeding in pregnancy, second           O46.92  trimester  [redacted] weeks gestation of pregnancy  Z3A.16  Encounter for antenatal screening,              Z36.9  unspecified ---------------------------------------------------------------------- Fetal Evaluation  Num Of Fetuses:          1  Fetal Heart Rate(bpm):   144  Cardiac Activity:        Observed  Presentation:            Cephalic  Placenta:                Anterior  P. Cord Insertion:       Visualized, central  Amniotic Fluid  AFI FV:      Within normal limits                              Largest Pocket(cm)                              5.5 ---------------------------------------------------------------------- OB History   Gravidity:    2         Term:   0        Prem:   0        SAB:   1  TOP:          0       Ectopic:  0        Living: 0 ---------------------------------------------------------------------- Gestational Age  Clinical EDD:  16w 4d                                        EDD:   06/05/22  Best:          16w 4d     Det. By:  Clinical EDD             EDD:   06/05/22 ---------------------------------------------------------------------- Anatomy  Thoracic:              Appears normal         Bladder:                Appears normal  Stomach:               Appears normal, left                         sided ---------------------------------------------------------------------- Cervix Uterus Adnexa  Cervix  Length:            3.7  cm.  Closed  Uterus  No abnormality visualized.  Right Ovary  No adnexal mass visualized.  Left Ovary  No adnexal mass visualized.  Cul De Sac  No free fluid seen.  Adnexa  No abnormality visualized. ---------------------------------------------------------------------- Comments  This patient preseneted to the MAU due to abdominal pain.  A limited ultrasound performed today shows that the fetus is  in the vertex presentation.  There was normal amniotic fluid noted.  A normal appearing anterior placenta is noted. ----------------------------------------------------------------------                   Ma RingsVictor Fang, MD Electronically Signed Final Report   12/23/2021 02:42 pm ----------------------------------------------------------------------    Recent Results (from the past 2160 hour(s))  Pregnancy, urine POC     Status: Abnormal   Collection Time: 10/06/21  8:52 PM  Result Value Ref Range  Preg Test, Ur POSITIVE (A) NEGATIVE    Comment:        THE SENSITIVITY OF THIS METHODOLOGY IS >24 mIU/mL   Urinalysis, Routine w reflex microscopic Urine, Clean Catch     Status: Abnormal   Collection Time: 10/06/21  9:30 PM  Result Value Ref Range   Color, Urine STRAW (A) YELLOW   APPearance CLEAR  CLEAR   Specific Gravity, Urine 1.009 1.005 - 1.030   pH 5.0 5.0 - 8.0   Glucose, UA NEGATIVE NEGATIVE mg/dL   Hgb urine dipstick SMALL (A) NEGATIVE   Bilirubin Urine NEGATIVE NEGATIVE   Ketones, ur NEGATIVE NEGATIVE mg/dL   Protein, ur NEGATIVE NEGATIVE mg/dL   Nitrite NEGATIVE NEGATIVE   Leukocytes,Ua NEGATIVE NEGATIVE   WBC, UA 0-5 0 - 5 WBC/hpf   Bacteria, UA NONE SEEN NONE SEEN   Squamous Epithelial / LPF 0-5 0 - 5   Mucus PRESENT     Comment: Performed at [facility], [address] [place], [place] [address]  GC/Chlamydia probe amp ([place])not at [facility]     Status: None   Collection Time: 10/06/21 10:03 PM  Result Value Ref Range   Neisseria Gonorrhea Negative    Chlamydia Negative    Comment Normal Reference [place] Chlamydia - Negative    Comment      Normal Reference Range Neisseria Gonorrhea - Negative  ABO/Rh     Status: None   Collection Time: 10/06/21 10:09 PM  Result Value Ref Range   ABO/RH([name]) O POS    No rh immune globuloin      NOT A RH IMMUNE GLOBULIN CANDIDATE, PT RH POSITIVE Performed at [facility], [address], [place], [place] [address]   CBC     Status: None   Collection Time: 10/06/21 10:09 PM  Result Value Ref Range   WBC 8.6 4.0 - 10.5 K/uL   RBC 4.49 3.87 - 5.11 MIL/uL   Hemoglobin 12.3 12.0 - 15.0 g/dL   HCT 88.9 16.9 - 45.0 %   MCV 83.5 80.0 - 100.0 fL   MCH 27.4 26.0 - 34.0 pg   MCHC 32.8 30.0 - 36.0 g/dL   RDW 38.8 82.8 - 00.3 %   Platelets 282 150 - 400 K/uL   nRBC 0.0 0.0 - 0.2 %    Comment: Performed at [facility], [address], [place], [place] [address]  hCG, quantitative, pregnancy     Status: Abnormal   Collection Time: 10/06/21 10:09 PM  Result Value Ref Range   hCG, Beta Chain, Quant, S 10,613 (H) <5 mIU/mL    Comment:          GEST. AGE      CONC.  (mIU/mL)   <=1 WEEK        5 - 50     2 WEEKS       50 - 500     3 WEEKS       100 - 10,000     4 WEEKS     1,000 - 30,000     5 WEEKS     3,500  - 115,000   6-8 WEEKS     12,000 - 270,000    12 WEEKS     15,000 - 220,000        FEMALE AND NON-PREGNANT FEMALE:     LESS THAN 5 mIU/mL Performed at [facility], [address], [place], [place] [address]  Wet prep, genital     Status: Abnormal   Collection Time: 10/06/21 10:45 PM   Specimen: PATH Cytology Cervicovaginal Ancillary Only  Result Value Ref Range   Yeast Wet Prep HPF POC NONE SEEN NONE SEEN   Trich, Wet Prep NONE SEEN NONE SEEN   Clue Cells Wet Prep HPF POC NONE SEEN NONE SEEN   WBC, Wet Prep HPF POC MANY (A) NONE SEEN   Sperm NONE SEEN     Comment: Performed at [facility], [place] [address] [place], [place] [address]  Urinalysis, Routine w reflex microscopic Urine, Clean Catch     Status: Abnormal   Collection Time: 11/23/21 12:10 AM  Result Value Ref Range   Color, Urine YELLOW YELLOW   APPearance HAZY (A) CLEAR   Specific Gravity, Urine 1.021 1.005 - 1.030   pH 5.0 5.0 - 8.0   Glucose, UA NEGATIVE NEGATIVE mg/dL   Hgb urine dipstick NEGATIVE NEGATIVE   Bilirubin Urine NEGATIVE NEGATIVE   Ketones, ur NEGATIVE NEGATIVE mg/dL   Protein, ur NEGATIVE NEGATIVE mg/dL   Nitrite NEGATIVE NEGATIVE   Leukocytes,Ua MODERATE (A) NEGATIVE   RBC / HPF 0-5 0 - 5 RBC/hpf   WBC, UA 21-50 0 - 5 WBC/hpf   Bacteria, UA RARE (A) NONE SEEN   Squamous Epithelial / LPF 0-5 0 - 5   Mucus PRESENT    Ca Oxalate Crys, UA PRESENT     Comment: Performed at [place] [address] [place], [place] [address]  Culture, [place] Urine     Status: Abnormal   Collection Time: 11/23/21 12:10 AM   Specimen: Urine, Random  Result Value Ref Range   Specimen Description URINE, RANDOM    Special Requests NONE    Culture (A)     MULTIPLE SPECIES PRESENT, SUGGEST RECOLLECTION NO GROUP B STREP (S.AGALACTIAE) ISOLATED Performed at [facility], [place] [address] [place], [place] [address]    Report Status 11/24/2021 FINAL   GC/Chlamydia probe amp ([place])not at [facility]      Status: None   Collection Time: 11/23/21  2:20 AM  Result Value Ref Range   Neisseria Gonorrhea Negative    Chlamydia Negative    Trichomonas Negative    Bacterial Vaginitis (gardnerella) Negative    Candida Vaginitis Negative    Candida Glabrata Negative    Comment      Normal Reference Range Bacterial Vaginosis - Negative   Comment Normal Reference Range Candida Species - Negative    Comment Normal Reference Range Candida Galbrata - Negative    Comment Normal Reference Range Trichomonas - Negative    Comment Normal Reference [place] Chlamydia - Negative    Comment      Normal Reference Range Neisseria Gonorrhea - Negative  Wet prep, genital     Status: Abnormal   Collection Time: 11/23/21  2:33 AM  Result Value Ref Range   Yeast Wet Prep HPF POC NONE SEEN NONE SEEN   Trich, Wet Prep NONE SEEN NONE SEEN   Clue Cells Wet Prep HPF POC NONE SEEN NONE SEEN   WBC, Wet Prep HPF POC >=10 (A) <10   Sperm NONE SEEN     Comment: Performed at [facility], [address] [place], [place] [address]  Wet prep, genital     Status: None   Collection Time: 12/23/21 12:15 PM  Result Value Ref Range   Yeast Wet Prep HPF POC NONE SEEN NONE SEEN  Trich, Wet Prep NONE SEEN NONE SEEN   Clue Cells Wet Prep HPF POC NONE SEEN NONE SEEN   WBC, Wet Prep HPF POC <10 <10   Sperm NONE SEEN     Comment: Performed at [facility], [address], [place], [place] [address]  Urinalysis, Routine w reflex microscopic Urine, Clean Catch     Status: Abnormal   Collection Time: 12/23/21 12:30 PM  Result Value Ref Range   Color, Urine YELLOW YELLOW   APPearance HAZY (A) CLEAR   Specific Gravity, Urine 1.019 1.005 - 1.030   pH 5.0 5.0 - 8.0   Glucose, UA NEGATIVE NEGATIVE mg/dL   Hgb urine dipstick NEGATIVE NEGATIVE   Bilirubin Urine NEGATIVE NEGATIVE   Ketones, ur NEGATIVE NEGATIVE mg/dL   Protein, ur NEGATIVE NEGATIVE mg/dL   Nitrite NEGATIVE NEGATIVE   Leukocytes,Ua TRACE (A) NEGATIVE    WBC, UA 6-10 0 - 5 WBC/hpf   Bacteria, UA RARE (A) NONE SEEN   Squamous Epithelial / LPF 6-10 0 - 5   Mucus PRESENT     Comment: Performed at [facility], [address] [place], [place] [address]  CBC with Differential/Platelet     Status: Abnormal   Collection Time: 12/23/21 12:50 PM  Result Value Ref Range   WBC 4.8 4.0 - 10.5 K/uL   RBC 3.81 (L) 3.87 - 5.11 MIL/uL   Hemoglobin 10.6 (L) 12.0 - 15.0 g/dL   HCT 79.8 (L) 92.1 - 19.4 %   MCV 85.6 80.0 - 100.0 fL   MCH 27.8 26.0 - 34.0 pg   MCHC 32.5 30.0 - 36.0 g/dL   RDW 17.4 08.1 - 44.8 %   Platelets 257 150 - 400 K/uL   nRBC 0.0 0.0 - 0.2 %   Neutrophils Relative % 62 %   Neutro Abs 3.0 1.7 - 7.7 K/uL   Lymphocytes Relative 30 %   Lymphs Abs 1.4 0.7 - 4.0 K/uL   Monocytes Relative 7 %   Monocytes Absolute 0.3 0.1 - 1.0 K/uL   Eosinophils Relative 1 %   Eosinophils Absolute 0.1 0.0 - 0.5 K/uL   Basophils Relative 0 %   Basophils Absolute 0.0 0.0 - 0.1 K/uL   Immature Granulocytes 0 %   Abs Immature Granulocytes 0.01 0.00 - 0.07 K/uL    Comment: Performed at [facility], [address], [place], [place] [address]  Comprehensive metabolic panel     Status: Abnormal   Collection Time: 12/23/21 12:50 PM  Result Value Ref Range   Sodium 136 135 - 145 mmol/L   Potassium 3.6 3.5 - 5.1 mmol/L   Chloride 109 98 - 111 mmol/L   CO2 20 (L) 22 - 32 mmol/L   Glucose, Bld 87 70 - 99 mg/dL    Comment: Glucose reference range applies only to samples taken after fasting for at least 8 hours.   BUN <5 (L) 6 - 20 mg/dL   Creatinine, Ser 1.49 (L) 0.44 - 1.00 mg/dL   Calcium 8.8 (L) 8.9 - 10.3 mg/dL   Total Protein 6.3 (L) 6.5 - 8.1 g/dL   Albumin 3.0 (L) 3.5 - 5.0 g/dL   AST 25 15 - 41 U/L   ALT 32 0 - 44 U/L   Alkaline Phosphatase 46 38 - 126 U/L   Total Bilirubin 0.7 0.3 - 1.2 mg/dL   GFR, Estimated >70 >26 mL/min    Comment: (NOTE) Calculated using the CKD-EPI Creatinine Equation (2021)  Anion gap 7 5 - 15     Comment: Performed at [place] [address] [place], [place] [address]      Review of Systems  Constitutional:  Negative for fever.  Gastrointestinal:  Positive for abdominal pain. Negative for diarrhea, nausea and vomiting.  Genitourinary:  Negative for vaginal bleeding and vaginal discharge.  Physical Exam   Blood pressure 114/71, pulse 91, temperature 97.9 F (36.6 C), temperature source Oral, resp. rate 20, height 5\' 1"  (1.549 m), weight 73.5 kg, last menstrual period 07/30/2021, SpO2 99 %, unknown if currently breastfeeding.  Physical Exam Vitals and nursing note reviewed. Exam conducted with a chaperone present.  Constitutional:      Appearance: She is well-developed.  Abdominal:     Tenderness: There is generalized abdominal tenderness and tenderness in the epigastric area. There is no guarding or rebound. Negative signs include Murphy's sign and McBurney's sign.  Genitourinary:    Comments: Vagina - Small amount of white vaginal discharge, no odor  Cervix - No contact bleeding, no active bleeding  Bimanual exam: Cervix closed GC/Chlam, wet prep done Chaperone present for exam.   Neurological:     Mental Status: She is alert and oriented to person, place, and time.   MAU Course  Procedures  MDM  O positive blood type Ibuprofen given, pain now 0/10 [place] ordered and reassuring.   Assessment and Plan   A:  1. [redacted] weeks gestation of pregnancy   2. Abdominal pain in pregnancy, second trimester      P:  Discharge home Discussed avoiding fried/spicy foods for now Return to MAU if symptoms worsen Pelvic rest  Shamiracle Gorden, Artist Pais, NP 12/25/2021 3:58 PM

## 2021-12-23 NOTE — MAU Note (Signed)
Peggy [name] is a 21 y.o. at [redacted]w[redacted]d here in MAU reporting: abdominal pain for the past 1.5 hours. States she took some tylenol with no relief. States pain has been getting worse. Saw some light pink this morning. No LOF.   Onset of complaint: today  Pain score: 10/10  Vitals:   12/23/21 1131  BP: 114/71  Pulse: 91  Resp: 20  Temp: 97.9 F (36.6 C)  SpO2: 99%     FHT:152  Lab orders placed from triage: UA

## 2022-01-14 ENCOUNTER — Ambulatory Visit: Payer: Medicaid Other

## 2022-01-21 ENCOUNTER — Inpatient Hospital Stay (HOSPITAL_COMMUNITY)
Admission: AD | Admit: 2022-01-21 | Discharge: 2022-01-22 | Disposition: A | Discharge status: Home / Self Care | Payer: Medicaid Other | Attending: Obstetrics and Gynecology | Admitting: Obstetrics and Gynecology

## 2022-01-21 ENCOUNTER — Other Ambulatory Visit: Payer: Self-pay

## 2022-01-21 ENCOUNTER — Inpatient Hospital Stay (HOSPITAL_COMMUNITY): Payer: Medicaid Other

## 2022-01-21 ENCOUNTER — Encounter (HOSPITAL_COMMUNITY): Payer: Self-pay | Admitting: Obstetrics and Gynecology

## 2022-01-21 DIAGNOSIS — O26612 Liver and biliary tract disorders in pregnancy, second trimester: Secondary | ICD-10-CM | POA: Insufficient documentation

## 2022-01-21 DIAGNOSIS — R1011 Right upper quadrant pain: Secondary | ICD-10-CM | POA: Diagnosis not present

## 2022-01-21 DIAGNOSIS — K8020 Calculus of gallbladder without cholecystitis without obstruction: Secondary | ICD-10-CM | POA: Diagnosis not present

## 2022-01-21 DIAGNOSIS — R109 Unspecified abdominal pain: Secondary | ICD-10-CM | POA: Diagnosis present

## 2022-01-21 DIAGNOSIS — Z3A20 20 weeks gestation of pregnancy: Secondary | ICD-10-CM

## 2022-01-21 DIAGNOSIS — U071 Other viral diseases complicating pregnancy, second trimester: Secondary | ICD-10-CM

## 2022-01-21 DIAGNOSIS — O98512 Other viral diseases complicating pregnancy, second trimester: Secondary | ICD-10-CM | POA: Insufficient documentation

## 2022-01-21 LAB — URINALYSIS, ROUTINE W REFLEX MICROSCOPIC
Bilirubin Urine: NEGATIVE
Glucose, UA: NEGATIVE mg/dL
Ketones, ur: NEGATIVE mg/dL
Leukocytes,Ua: NEGATIVE
Nitrite: NEGATIVE
Protein, ur: NEGATIVE mg/dL
Specific Gravity, Urine: 1.02 (ref 1.005–1.030)
pH: 7 (ref 5.0–8.0)

## 2022-01-21 LAB — RESP PANEL BY RT-PCR (FLU A&B, COVID) ARPGX2
Influenza A by PCR: NEGATIVE
Influenza B by PCR: NEGATIVE
SARS Coronavirus 2 by RT PCR: POSITIVE — AB

## 2022-01-21 LAB — URINALYSIS, MICROSCOPIC (REFLEX)

## 2022-01-21 MED ORDER — OXYCODONE-ACETAMINOPHEN 5-325 MG PO TABS
2.0000 | ORAL_TABLET | Freq: Once | ORAL | Status: AC
  Administered 2022-01-21: 2 via ORAL
  Filled 2022-01-21: qty 2

## 2022-01-21 MED ORDER — PROMETHAZINE HCL 25 MG PO TABS
25.0000 mg | ORAL_TABLET | Freq: Once | ORAL | Status: AC
  Administered 2022-01-21: 25 mg via ORAL
  Filled 2022-01-21: qty 1

## 2022-01-21 MED ORDER — HYDROMORPHONE HCL 2 MG PO TABS
2.0000 mg | ORAL_TABLET | Freq: Once | ORAL | Status: DC
Start: 2022-01-21 — End: 2022-01-21

## 2022-01-21 NOTE — MAU Provider Note (Signed)
History     CSN: [id number]  Arrival date and time: 01/21/22 2201   Event Date/Time   First Provider Initiated Contact with Patient 01/21/22 2319      Chief Complaint  Patient presents with   Abdominal Pain   Nausea   HPI Peggy [name] is a 21 y.o. G2P0010 at [redacted]w[redacted]d who presents with abdominal pain & nausea. Symptoms started this morning. Reports constant sharp/stabbing pain in RUQ just under her ribs. Pain is worse with sitting upright & deep breaths. Associated with nausea. Rates pain 7/10. Took tylenol this morning without relief.  Denies fever, cough, vomiting, lower abdominal pain, vaginal bleeding, diarrhea.  Ate steak, rice, and beans last night for dinner & this morning for breakfast.   OB History     Gravida  2   Para      Term      Preterm      AB  1   Living         SAB  1   IAB      Ectopic      Multiple      Live Births              Past Medical History:  Diagnosis Date   Asthma    Insulin resistance    and oligomenorrhea    Past Surgical History:  Procedure Laterality Date   NO PAST SURGERIES      Family History  Problem Relation Age of Onset   Hypertension Mother    Hypertension Maternal Grandmother    Allergic rhinitis Neg Hx    Angioedema Neg Hx    Asthma Neg Hx    Eczema Neg Hx    Immunodeficiency Neg Hx    Urticaria Neg Hx     Social History   Tobacco Use   Smoking status: Never   Smokeless tobacco: Never  Vaping Use   Vaping Use: Never used  Substance Use Topics   Alcohol use: Not Currently    Alcohol/week: 0.0 standard drinks   Drug use: No    Allergies: No Known Allergies  No medications prior to admission.    Review of Systems  Constitutional: Negative.   Respiratory:  Negative for cough and shortness of breath.   Gastrointestinal:  Positive for abdominal pain and nausea. Negative for constipation, diarrhea and vomiting.  Genitourinary: Negative.   Physical Exam   Blood  pressure (!) 91/58, pulse (!) 113, temperature 100 F (37.8 C), temperature source Oral, resp. rate 20, height 5\' 1"  (1.549 m), weight 74.8 kg, last menstrual period 07/30/2021, SpO2 100 %, unknown if currently breastfeeding.  Physical Exam Vitals and nursing note reviewed.  Constitutional:      Appearance: She is well-developed. She is not toxic-appearing or diaphoretic.  HENT:     Head: Normocephalic and atraumatic.  Pulmonary:     Effort: Pulmonary effort is normal. No respiratory distress.     Breath sounds: Normal breath sounds. No wheezing.  Abdominal:     General: Bowel sounds are normal.     Palpations: Abdomen is soft.     Tenderness: There is abdominal tenderness in the right upper quadrant. Positive signs include Murphy's sign.     Comments: gravid  Skin:    General: Skin is warm and dry.  Neurological:     General: No focal deficit present.     Mental Status: She is alert.  Psychiatric:        Mood and Affect: Mood  normal.        Behavior: Behavior normal.    MAU Course  Procedures Results for orders placed or performed during the hospital encounter of 01/21/22 (from the past 24 hour(s))  Urinalysis, Routine w reflex microscopic Urine, Clean Catch     Status: Abnormal   Collection Time: 01/21/22 10:30 PM  Result Value Ref Range   Color, Urine YELLOW YELLOW   APPearance CLEAR CLEAR   Specific Gravity, Urine 1.020 1.005 - 1.030   pH 7.0 5.0 - 8.0   Glucose, UA NEGATIVE NEGATIVE mg/dL   Hgb urine dipstick SMALL (A) NEGATIVE   Bilirubin Urine NEGATIVE NEGATIVE   Ketones, ur NEGATIVE NEGATIVE mg/dL   Protein, ur NEGATIVE NEGATIVE mg/dL   Nitrite NEGATIVE NEGATIVE   Leukocytes,Ua NEGATIVE NEGATIVE  Urinalysis, Microscopic (reflex)     Status: Abnormal   Collection Time: 01/21/22 10:30 PM  Result Value Ref Range   RBC / HPF 6-10 0 - 5 RBC/hpf   WBC, UA 0-5 0 - 5 WBC/hpf   Bacteria, UA RARE (A) NONE SEEN   Squamous Epithelial / LPF 0-5 0 - 5   Mucus PRESENT    Resp Panel by RT-PCR (Flu A&B, Covid) Nasopharyngeal Swab     Status: Abnormal   Collection Time: 01/21/22 10:36 PM   Specimen: Nasopharyngeal Swab; Nasopharyngeal(NP) swabs in vial transport medium  Result Value Ref Range   SARS Coronavirus 2 by RT PCR POSITIVE (A) NEGATIVE   Influenza A by PCR NEGATIVE NEGATIVE   Influenza B by PCR NEGATIVE NEGATIVE  CBC with Differential/Platelet     Status: Abnormal   Collection Time: 01/21/22 11:36 PM  Result Value Ref Range   WBC 6.3 4.0 - 10.5 K/uL   RBC 3.90 3.87 - 5.11 MIL/uL   Hemoglobin 10.9 (L) 12.0 - 15.0 g/dL   HCT 13.033.9 (L) 86.536.0 - 78.446.0 %   MCV 86.9 80.0 - 100.0 fL   MCH 27.9 26.0 - 34.0 pg   MCHC 32.2 30.0 - 36.0 g/dL   RDW 69.614.4 29.511.5 - 28.415.5 %   Platelets 257 150 - 400 K/uL   nRBC 0.0 0.0 - 0.2 %   Neutrophils Relative % 78 %   Neutro Abs 4.9 1.7 - 7.7 K/uL   Lymphocytes Relative 15 %   Lymphs Abs 0.9 0.7 - 4.0 K/uL   Monocytes Relative 7 %   Monocytes Absolute 0.4 0.1 - 1.0 K/uL   Eosinophils Relative 0 %   Eosinophils Absolute 0.0 0.0 - 0.5 K/uL   Basophils Relative 0 %   Basophils Absolute 0.0 0.0 - 0.1 K/uL   Immature Granulocytes 0 %   Abs Immature Granulocytes 0.01 0.00 - 0.07 K/uL  Comprehensive metabolic panel     Status: Abnormal   Collection Time: 01/21/22 11:36 PM  Result Value Ref Range   Sodium 136 135 - 145 mmol/L   Potassium 3.6 3.5 - 5.1 mmol/L   Chloride 104 98 - 111 mmol/L   CO2 22 22 - 32 mmol/L   Glucose, Bld 105 (H) 70 - 99 mg/dL   BUN <5 (L) 6 - 20 mg/dL   Creatinine, Ser 1.320.49 0.44 - 1.00 mg/dL   Calcium 9.0 8.9 - 44.010.3 mg/dL   Total Protein 6.8 6.5 - 8.1 g/dL   Albumin 3.2 (L) 3.5 - 5.0 g/dL   AST 19 15 - 41 U/L   ALT 18 0 - 44 U/L   Alkaline Phosphatase 56 38 - 126 U/L   Total Bilirubin 0.9 0.3 -  1.2 mg/dL   GFR, Estimated >09 >32 mL/min   Anion gap 10 5 - 15  Lactic acid, plasma     Status: None   Collection Time: 01/21/22 11:36 PM  Result Value Ref Range   Lactic Acid, Venous 1.2 0.5 - 1.9  mmol/L  Lipase, blood     Status: None   Collection Time: 01/21/22 11:36 PM  Result Value Ref Range   Lipase 33 11 - 51 U/L   US ABDOMEN LIMITED RUQ (LIVER/GB)  Result Date: 01/22/2022 CLINICAL DATA:  Sharp right upper quadrant pain. EXAM: ULTRASOUND ABDOMEN LIMITED RIGHT UPPER QUADRANT COMPARISON:  None. FINDINGS: Gallbladder: Shadowing echogenic avascular material is present in the gallbladder measuring 4.5 x 1.7 cm. No gallbladder wall thickening or pericholecystic fluid. No sonographic Murphy sign noted by sonographer. Common bile duct: Diameter: 2.9 mm. Liver: No focal lesion identified. Mildly increased parenchymal echogenicity. Portal vein is patent on color Doppler imaging with normal direction of blood flow towards the liver. Other: No free fluid IMPRESSION: . 1. Echogenic, shadowing, avascular material in the gallbladder may represent stones and/or sludge, less likely mass. No evidence of acute cholecystitis. 2. Mild hepatic steatosis. Electronically Signed   By: Thornell Sartorius M.D.   On: 01/22/2022 00:22    MDM Patient presents with RUQ pain & nausea. Ultrasound shows gallbladder sludge. Pain improved with phenergan & percocet given in MAU. Patient's temp up to 100.1 while here - incidentally she was found to be covid positive. Labs normal & otherwise has no signs of cholecystitis.   Assessment and Plan   1. Calculus of gallbladder without cholecystitis without obstruction  -rx phenergan  -reviewed reasons to return to MAU -f/u with [facility] as needed or after pregnancy  2. RUQ pain   3. COVID-19 affecting pregnancy in second trimester  -OTC meds safe in pregnancy list given -discussed quarantine -will call office in the morning to change next appointment to virtual or reschedule  4. [redacted] weeks gestation of pregnancy      Judeth Horn 01/22/2022, 1:17 AM

## 2022-01-21 NOTE — MAU Note (Signed)
..  Peggy [name] is a 21 y.o. at [redacted]w[redacted]d here in MAU reporting: Sharp constant pain under pt right rib, that started around 0700, but got more intense throughout the day. Pt reports having SOB due to the pain. Pt reports feeling nauseous since yesterday that newly onset since early in pregnancy. Pt reports a HA constant with no relief from Tylenol 1000mg , and dizziness for the last few days. PT took some Tylenol at 0800 today. Pt states she feels hot externally, but freezing internally. Pt denies cough, fever, congestion, DFM, VB, LOF, abnormal discharge and abnormal edema. Pt reports no known exposure to COVID or FLU. Pt states she just wants to make sure baby is ok.  Onset of complaint: 0700 Pain score: 8/10  Vitals:   01/21/22 2222 01/21/22 2223  BP:  114/70  Pulse:  (!) 128  Resp:  (!) 24  Temp:  99.8 F (37.7 C)  SpO2: 100% 100%     FHT:150  Lab orders placed from triage:  UA

## 2022-01-22 DIAGNOSIS — R1011 Right upper quadrant pain: Secondary | ICD-10-CM

## 2022-01-22 DIAGNOSIS — K8020 Calculus of gallbladder without cholecystitis without obstruction: Secondary | ICD-10-CM

## 2022-01-22 DIAGNOSIS — Z3A20 20 weeks gestation of pregnancy: Secondary | ICD-10-CM

## 2022-01-22 DIAGNOSIS — O98512 Other viral diseases complicating pregnancy, second trimester: Secondary | ICD-10-CM

## 2022-01-22 DIAGNOSIS — U071 COVID-19: Secondary | ICD-10-CM

## 2022-01-22 LAB — COMPREHENSIVE METABOLIC PANEL
ALT: 18 U/L (ref 0–44)
AST: 19 U/L (ref 15–41)
Albumin: 3.2 g/dL — ABNORMAL LOW (ref 3.5–5.0)
Alkaline Phosphatase: 56 U/L (ref 38–126)
Anion gap: 10 (ref 5–15)
BUN: <5 mg/dL — ABNORMAL LOW (ref 6–20)
CO2: 22 mmol/L (ref 22–32)
Calcium: 9 mg/dL (ref 8.9–10.3)
Chloride: 104 mmol/L (ref 98–111)
Creatinine, Ser: 0.49 mg/dL (ref 0.44–1.00)
GFR, Estimated: >60 mL/min (ref >60)
Glucose, Bld: 105 mg/dL — ABNORMAL HIGH (ref 70–99)
Potassium: 3.6 mmol/L (ref 3.5–5.1)
Sodium: 136 mmol/L (ref 135–145)
Total Bilirubin: 0.9 mg/dL (ref 0.3–1.2)
Total Protein: 6.8 g/dL (ref 6.5–8.1)

## 2022-01-22 LAB — CBC WITH DIFFERENTIAL/PLATELET
Abs Immature Granulocytes: 0.01 10*3/uL (ref 0.00–0.07)
Basophils Absolute: 0 10*3/uL (ref 0.0–0.1)
Basophils Relative: 0 %
Eosinophils Absolute: 0 10*3/uL (ref 0.0–0.5)
Eosinophils Relative: 0 %
HCT: 33.9 % — ABNORMAL LOW (ref 36.0–46.0)
Hemoglobin: 10.9 g/dL — ABNORMAL LOW (ref 12.0–15.0)
Immature Granulocytes: 0 %
Lymphocytes Relative: 15 %
Lymphs Abs: 0.9 10*3/uL (ref 0.7–4.0)
MCH: 27.9 pg (ref 26.0–34.0)
MCHC: 32.2 g/dL (ref 30.0–36.0)
MCV: 86.9 fL (ref 80.0–100.0)
Monocytes Absolute: 0.4 10*3/uL (ref 0.1–1.0)
Monocytes Relative: 7 %
Neutro Abs: 4.9 10*3/uL (ref 1.7–7.7)
Neutrophils Relative %: 78 %
Platelets: 257 10*3/uL (ref 150–400)
RBC: 3.9 MIL/uL (ref 3.87–5.11)
RDW: 14.4 % (ref 11.5–15.5)
WBC: 6.3 10*3/uL (ref 4.0–10.5)
nRBC: 0 % (ref 0.0–0.2)

## 2022-01-22 LAB — LIPASE, BLOOD: Lipase: 33 U/L (ref 11–51)

## 2022-01-22 LAB — LACTIC ACID, PLASMA: Lactic Acid, Venous: 1.2 mmol/L (ref 0.5–1.9)

## 2022-01-22 MED ORDER — PROMETHAZINE HCL 25 MG PO TABS: 25.0000 mg | ORAL_TABLET | Freq: Four times a day (QID) | ORAL | 0 refills | Status: DC | PRN

## 2022-01-22 NOTE — Discharge Instructions (Signed)

## 2022-02-16 ENCOUNTER — Inpatient Hospital Stay (HOSPITAL_COMMUNITY)
Admission: AD | Admit: 2022-02-16 | Discharge: 2022-02-17 | Disposition: A | Discharge status: Home / Self Care | Payer: Medicaid Other | Attending: Obstetrics and Gynecology | Admitting: Obstetrics and Gynecology

## 2022-02-16 ENCOUNTER — Other Ambulatory Visit: Payer: Self-pay

## 2022-02-16 ENCOUNTER — Encounter (HOSPITAL_COMMUNITY): Payer: Self-pay | Admitting: Obstetrics and Gynecology

## 2022-02-16 DIAGNOSIS — R102 Pelvic and perineal pain: Secondary | ICD-10-CM | POA: Diagnosis not present

## 2022-02-16 DIAGNOSIS — Z3A24 24 weeks gestation of pregnancy: Secondary | ICD-10-CM | POA: Diagnosis not present

## 2022-02-16 DIAGNOSIS — O26899 Other specified pregnancy related conditions, unspecified trimester: Secondary | ICD-10-CM | POA: Diagnosis not present

## 2022-02-16 DIAGNOSIS — O368120 Decreased fetal movements, second trimester, not applicable or unspecified: Secondary | ICD-10-CM | POA: Insufficient documentation

## 2022-02-16 DIAGNOSIS — O26892 Other specified pregnancy related conditions, second trimester: Secondary | ICD-10-CM | POA: Insufficient documentation

## 2022-02-16 NOTE — MAU Note (Signed)
Pt states she hasn't been feeling baby move as much last night or today.

## 2022-02-16 NOTE — MAU Note (Signed)
Pt reports to MAU with c/o DFM since earlier this am.  Pt reports feeling baby move 3 x since the am.  No VB and LOF.  Pt reports that she is having pressure in vagina that started about two hours ago.

## 2022-02-16 NOTE — MAU Provider Note (Signed)
History     CSN: [id number]  Arrival date and time: 02/16/22 2210   Event Date/Time   First Provider Initiated Contact with Patient 02/16/22 2344      Chief Complaint  Patient presents with   Decreased Fetal Movement   Peggy [name] is a 21 y.o. year old G9P0010 female at [redacted]w[redacted]d weeks gestation who presents to MAU reporting DFM for over 16 hours. She states, "she has a routine of movement. She moves really good early in the morning and then late at night. Her movements are usually so good that her kicks are visible outside my belly." She has only felt FM 3 times since this AM. She also complains of vaginal pressure for 2 hours. She reports only having 1.5 bottles of water today. She states, "I was not home today, but I usually drink big hydroflask bottles of water all day." She receives [facility] with [facility]; next appt is 03/05/2022. Her spouse is present and contributing to the history taking.   OB History     Gravida  2   Para      Term      Preterm      AB  1   Living         SAB  1   IAB      Ectopic      Multiple      Live Births              Past Medical History:  Diagnosis Date   Asthma    Insulin resistance    and oligomenorrhea    Past Surgical History:  Procedure Laterality Date   NO PAST SURGERIES      Family History  Problem Relation Age of Onset   Hypertension Mother    Hypertension Maternal Grandmother    Allergic rhinitis Neg Hx    Angioedema Neg Hx    Asthma Neg Hx    Eczema Neg Hx    Immunodeficiency Neg Hx    Urticaria Neg Hx     Social History   Tobacco Use   Smoking status: Never   Smokeless tobacco: Never  Vaping Use   Vaping Use: Never used  Substance Use Topics   Alcohol use: Not Currently    Alcohol/week: 0.0 standard drinks   Drug use: No    Allergies: No Known Allergies  Medications Prior to Admission  Medication Sig Dispense Refill Last Dose   famotidine (PEPCID) 20 MG  tablet Take 1 tablet (20 mg total) by mouth 2 (two) times daily. 60 tablet 5 Past Week   montelukast (SINGULAIR) 10 MG tablet Take 1 tablet (10 mg total) by mouth at bedtime. 30 tablet 5 Past Week   Prenatal Vit-Fe Fumarate-FA (PREPLUS) 27-1 MG TABS Take 1 tablet by mouth daily.   02/16/2022   promethazine (PHENERGAN) 25 MG tablet Take 1 tablet (25 mg total) by mouth every 6 (six) hours as needed for nausea or vomiting. 30 tablet 0 Past Week   albuterol (VENTOLIN HFA) 108 (90 Base) MCG/ACT inhaler Inhale 2 puffs into the lungs every 4 (four) hours as needed for wheezing or shortness of breath (coughing fits). 18 g 1 More than a month   budesonide-formoterol (SYMBICORT) 160-4.5 MCG/ACT inhaler Inhale 2 puffs into the lungs in the morning and at bedtime. with spacer and rinse mouth afterwards. 1 each 5 More than a month   fluticasone (FLONASE) 50 MCG/ACT nasal spray Place 1 spray into both nostrils  2 (two) times daily as needed for allergies or rhinitis. 16 g 5 More than a month   ondansetron (ZOFRAN-ODT) 4 MG disintegrating tablet Take 1-2 tablets (4-8 mg total) by mouth every 6 (six) hours as needed for nausea or vomiting. 30 tablet 1 More than a month    Review of Systems  Constitutional: Negative.   HENT: Negative.    Eyes: Negative.   Respiratory: Negative.    Cardiovascular: Negative.   Gastrointestinal: Negative.   Endocrine: Negative.   Genitourinary:  Positive for vaginal pain (pressure x 2 hrs).       DFM since last night  Musculoskeletal: Negative.   Skin: Negative.   Allergic/Immunologic: Negative.   Neurological: Negative.   Hematological: Negative.   Psychiatric/Behavioral: Negative.    Physical Exam   Blood pressure 102/63, pulse 89, temperature 98.2 F (36.8 C), resp. rate 18, height 5\' 1"  (1.549 m), weight 75.6 kg, last menstrual period 07/30/2021, SpO2 100 %, unknown if currently breastfeeding.  Physical Exam Vitals and nursing note reviewed.  Constitutional:       Appearance: Normal appearance. She is normal weight.  Cardiovascular:     Rate and Rhythm: Normal rate.  Pulmonary:     Effort: Pulmonary effort is normal.  Abdominal:     Palpations: Abdomen is soft.  Genitourinary:    Comments: Cervical exam deferred Musculoskeletal:        General: Normal range of motion.  Skin:    General: Skin is warm and dry.  Neurological:     Mental Status: She is alert and oriented to person, place, and time.  Psychiatric:        Mood and Affect: Mood normal.        Behavior: Behavior normal.        Thought Content: Thought content normal.        Judgment: Judgment normal.   REASSURING NST - FHR: 145 bpm / moderate variability / 10 x 10 accels present / decels absent / TOCO: UI noted MAU Course  Procedures  MDM CCUA UCx -- Results pending   Results for orders placed or performed during the hospital encounter of 02/16/22 (from the past 24 hour(s))  Urinalysis, Routine w reflex microscopic Urine, Clean Catch     Status: Abnormal   Collection Time: 02/17/22 12:00 AM  Result Value Ref Range   Color, Urine YELLOW YELLOW   APPearance HAZY (A) CLEAR   Specific Gravity, Urine 1.017 1.005 - 1.030   pH 6.0 5.0 - 8.0   Glucose, UA NEGATIVE NEGATIVE mg/dL   Hgb urine dipstick NEGATIVE NEGATIVE   Bilirubin Urine NEGATIVE NEGATIVE   Ketones, ur NEGATIVE NEGATIVE mg/dL   Protein, ur NEGATIVE NEGATIVE mg/dL   Nitrite NEGATIVE NEGATIVE   Leukocytes,Ua NEGATIVE NEGATIVE    Assessment and Plan  Pelvic pressure in pregnancy - Advised that she could have cramping or pressure from not drinking enough water - Reassurance given that NST was WNL for gestational age  [redacted] weeks gestation of pregnancy   - Discharge patient - Keep scheduled appt with [facility] on 03/05/2022 - Patient verbalized an understanding of the plan of care and agrees.    03/07/2022, CNM 02/16/2022, 11:44 PM

## 2022-02-17 DIAGNOSIS — Z3A24 24 weeks gestation of pregnancy: Secondary | ICD-10-CM | POA: Diagnosis not present

## 2022-02-17 DIAGNOSIS — O26899 Other specified pregnancy related conditions, unspecified trimester: Secondary | ICD-10-CM | POA: Diagnosis not present

## 2022-02-17 DIAGNOSIS — O26892 Other specified pregnancy related conditions, second trimester: Secondary | ICD-10-CM | POA: Diagnosis not present

## 2022-02-17 DIAGNOSIS — O368120 Decreased fetal movements, second trimester, not applicable or unspecified: Secondary | ICD-10-CM | POA: Diagnosis not present

## 2022-02-17 DIAGNOSIS — R102 Pelvic and perineal pain: Secondary | ICD-10-CM | POA: Diagnosis not present

## 2022-02-17 LAB — URINALYSIS, ROUTINE W REFLEX MICROSCOPIC
Bilirubin Urine: NEGATIVE
Glucose, UA: NEGATIVE mg/dL
Hgb urine dipstick: NEGATIVE
Ketones, ur: NEGATIVE mg/dL
Leukocytes,Ua: NEGATIVE
Nitrite: NEGATIVE
Protein, ur: NEGATIVE mg/dL
Specific Gravity, Urine: 1.017 (ref 1.005–1.030)
pH: 6 (ref 5.0–8.0)

## 2022-02-18 LAB — CULTURE, OB URINE

## 2022-03-07 ENCOUNTER — Other Ambulatory Visit: Payer: Self-pay

## 2022-03-07 ENCOUNTER — Encounter (HOSPITAL_COMMUNITY): Payer: Self-pay | Admitting: Obstetrics and Gynecology

## 2022-03-07 ENCOUNTER — Inpatient Hospital Stay (HOSPITAL_COMMUNITY)
Admission: AD | Admit: 2022-03-07 | Discharge: 2022-03-07 | Disposition: A | Discharge status: Home / Self Care | Payer: Medicaid Other | Attending: Obstetrics and Gynecology | Admitting: Obstetrics and Gynecology

## 2022-03-07 DIAGNOSIS — R519 Headache, unspecified: Secondary | ICD-10-CM | POA: Insufficient documentation

## 2022-03-07 DIAGNOSIS — R42 Dizziness and giddiness: Secondary | ICD-10-CM | POA: Diagnosis not present

## 2022-03-07 DIAGNOSIS — R109 Unspecified abdominal pain: Secondary | ICD-10-CM | POA: Insufficient documentation

## 2022-03-07 DIAGNOSIS — Z3A27 27 weeks gestation of pregnancy: Secondary | ICD-10-CM | POA: Diagnosis not present

## 2022-03-07 DIAGNOSIS — O26892 Other specified pregnancy related conditions, second trimester: Secondary | ICD-10-CM | POA: Insufficient documentation

## 2022-03-07 DIAGNOSIS — J45909 Unspecified asthma, uncomplicated: Secondary | ICD-10-CM | POA: Insufficient documentation

## 2022-03-07 DIAGNOSIS — O99512 Diseases of the respiratory system complicating pregnancy, second trimester: Secondary | ICD-10-CM | POA: Insufficient documentation

## 2022-03-07 DIAGNOSIS — O26899 Other specified pregnancy related conditions, unspecified trimester: Secondary | ICD-10-CM

## 2022-03-07 DIAGNOSIS — R0602 Shortness of breath: Secondary | ICD-10-CM | POA: Diagnosis not present

## 2022-03-07 LAB — URINALYSIS, ROUTINE W REFLEX MICROSCOPIC
Bilirubin Urine: NEGATIVE
Glucose, UA: NEGATIVE mg/dL
Ketones, ur: NEGATIVE mg/dL
Leukocytes,Ua: NEGATIVE
Nitrite: NEGATIVE
Protein, ur: 30 mg/dL — AB
Specific Gravity, Urine: 1.027 (ref 1.005–1.030)
pH: 5 (ref 5.0–8.0)

## 2022-03-07 LAB — GLUCOSE, CAPILLARY: Glucose-Capillary: 93 mg/dL (ref 70–99)

## 2022-03-07 LAB — POCT FERN TEST: POCT Fern Test: NEGATIVE

## 2022-03-07 LAB — WET PREP, GENITAL
Clue Cells Wet Prep HPF POC: NONE SEEN
Sperm: NONE SEEN
Trich, Wet Prep: NONE SEEN
WBC, Wet Prep HPF POC: <10 (ref <10)
Yeast Wet Prep HPF POC: NONE SEEN

## 2022-03-07 MED ORDER — ACETAMINOPHEN 500 MG PO TABS
1000.0000 mg | ORAL_TABLET | Freq: Once | ORAL | Status: AC
  Administered 2022-03-07: 1000 mg via ORAL
  Filled 2022-03-07: qty 2
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
## 2022-03-09 LAB — CULTURE, OB URINE: Culture: <10000 — AB

## 2022-03-10 LAB — GC/CHLAMYDIA PROBE AMP (~~LOC~~) NOT AT ARMC
Chlamydia: NEGATIVE
Comment: NEGATIVE
Comment: NORMAL
Neisseria Gonorrhea: NEGATIVE

## 2022-03-12 ENCOUNTER — Encounter (HOSPITAL_COMMUNITY): Payer: Self-pay | Admitting: Obstetrics and Gynecology

## 2022-03-12 ENCOUNTER — Other Ambulatory Visit: Payer: Self-pay

## 2022-03-12 ENCOUNTER — Inpatient Hospital Stay (HOSPITAL_COMMUNITY)
Admission: EM | Admit: 2022-03-12 | Discharge: 2022-03-12 | Disposition: A | Discharge status: Home / Self Care | Payer: No Typology Code available for payment source | Attending: Obstetrics and Gynecology | Admitting: Obstetrics and Gynecology

## 2022-03-12 ENCOUNTER — Inpatient Hospital Stay (HOSPITAL_BASED_OUTPATIENT_CLINIC_OR_DEPARTMENT_OTHER): Payer: No Typology Code available for payment source

## 2022-03-12 DIAGNOSIS — Z3A28 28 weeks gestation of pregnancy: Secondary | ICD-10-CM | POA: Diagnosis not present

## 2022-03-12 DIAGNOSIS — R109 Unspecified abdominal pain: Secondary | ICD-10-CM | POA: Diagnosis not present

## 2022-03-12 DIAGNOSIS — Z3A27 27 weeks gestation of pregnancy: Secondary | ICD-10-CM | POA: Diagnosis not present

## 2022-03-12 DIAGNOSIS — O9A213 Injury, poisoning and certain other consequences of external causes complicating pregnancy, third trimester: Secondary | ICD-10-CM | POA: Diagnosis present

## 2022-03-12 DIAGNOSIS — R1030 Lower abdominal pain, unspecified: Secondary | ICD-10-CM | POA: Diagnosis not present

## 2022-03-12 DIAGNOSIS — O9A212 Injury, poisoning and certain other consequences of external causes complicating pregnancy, second trimester: Secondary | ICD-10-CM

## 2022-03-12 LAB — CBC WITH DIFFERENTIAL/PLATELET
Abs Immature Granulocytes: 0.01 10*3/uL (ref 0.00–0.07)
Basophils Absolute: 0 10*3/uL (ref 0.0–0.1)
Basophils Relative: 0 %
Eosinophils Absolute: 0 10*3/uL (ref 0.0–0.5)
Eosinophils Relative: 0 %
HCT: 31.9 % — ABNORMAL LOW (ref 36.0–46.0)
Hemoglobin: 10.3 g/dL — ABNORMAL LOW (ref 12.0–15.0)
Immature Granulocytes: 0 %
Lymphocytes Relative: 24 %
Lymphs Abs: 1.6 10*3/uL (ref 0.7–4.0)
MCH: 27.6 pg (ref 26.0–34.0)
MCHC: 32.3 g/dL (ref 30.0–36.0)
MCV: 85.5 fL (ref 80.0–100.0)
Monocytes Absolute: 0.4 10*3/uL (ref 0.1–1.0)
Monocytes Relative: 6 %
Neutro Abs: 4.5 10*3/uL (ref 1.7–7.7)
Neutrophils Relative %: 70 %
Platelets: 263 10*3/uL (ref 150–400)
RBC: 3.73 MIL/uL — ABNORMAL LOW (ref 3.87–5.11)
RDW: 13.9 % (ref 11.5–15.5)
WBC: 6.5 10*3/uL (ref 4.0–10.5)
nRBC: 0 % (ref 0.0–0.2)

## 2022-03-12 LAB — URINALYSIS, ROUTINE W REFLEX MICROSCOPIC
Bilirubin Urine: NEGATIVE
Glucose, UA: NEGATIVE mg/dL
Hgb urine dipstick: NEGATIVE
Ketones, ur: 20 mg/dL — AB
Nitrite: NEGATIVE
Protein, ur: NEGATIVE mg/dL
Specific Gravity, Urine: 1.009 (ref 1.005–1.030)
pH: 7 (ref 5.0–8.0)

## 2022-03-12 LAB — COMPREHENSIVE METABOLIC PANEL
ALT: 11 U/L (ref 0–44)
AST: 15 U/L (ref 15–41)
Albumin: 2.9 g/dL — ABNORMAL LOW (ref 3.5–5.0)
Alkaline Phosphatase: 64 U/L (ref 38–126)
Anion gap: 8 (ref 5–15)
BUN: <5 mg/dL — ABNORMAL LOW (ref 6–20)
CO2: 19 mmol/L — ABNORMAL LOW (ref 22–32)
Calcium: 9 mg/dL (ref 8.9–10.3)
Chloride: 109 mmol/L (ref 98–111)
Creatinine, Ser: 0.35 mg/dL — ABNORMAL LOW (ref 0.44–1.00)
GFR, Estimated: >60 mL/min (ref >60)
Glucose, Bld: 82 mg/dL (ref 70–99)
Potassium: 3.7 mmol/L (ref 3.5–5.1)
Sodium: 136 mmol/L (ref 135–145)
Total Bilirubin: 1 mg/dL (ref 0.3–1.2)
Total Protein: 6.6 g/dL (ref 6.5–8.1)

## 2022-03-12 LAB — KLEIHAUER-BETKE STAIN
Fetal Cells %: 0 %
Quantitation Fetal Hemoglobin: 0 mL

## 2022-03-12 IMAGING — US US MFM OB LIMITED
1 series · 15 of 27 positions shown · non-contrast
Comparison: none

[Series 1: us mfm ob limited · 27 acquisitions, 15 frames shown]
[im 1/27]
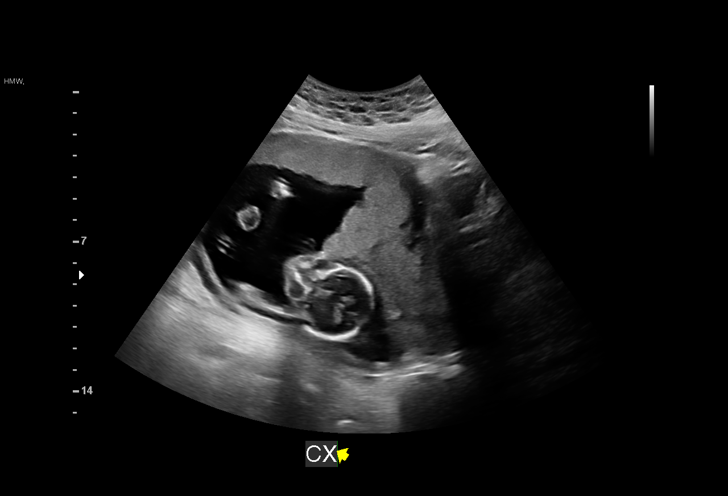
[im 3/27]
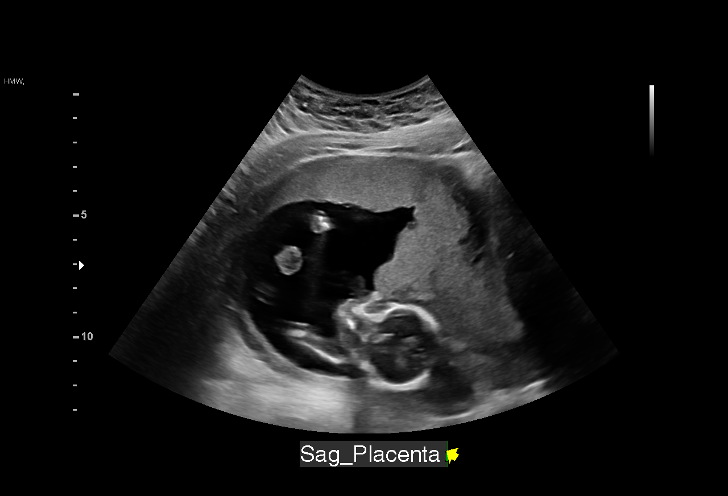
[im 5/27]
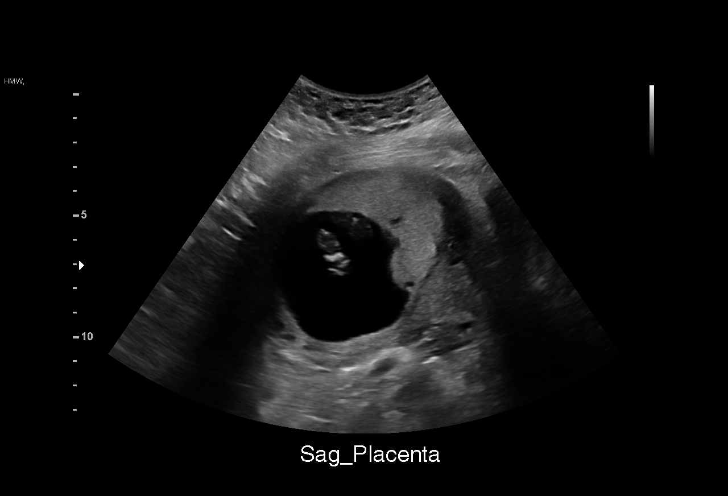
[im 7/27]
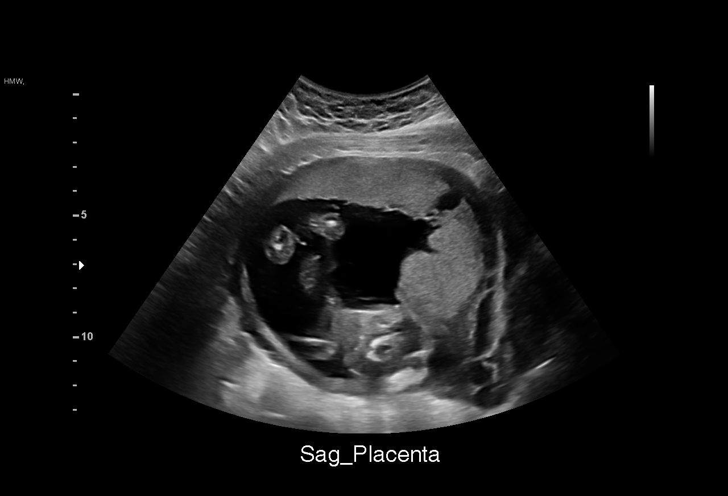
[im 9/27]
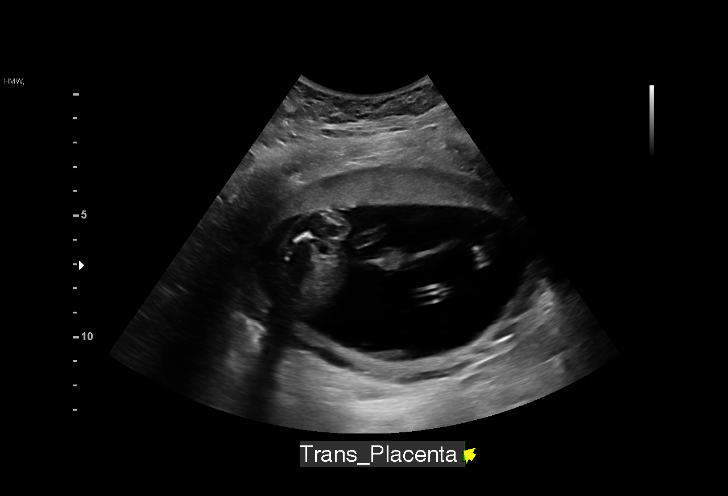
[im 10/27]
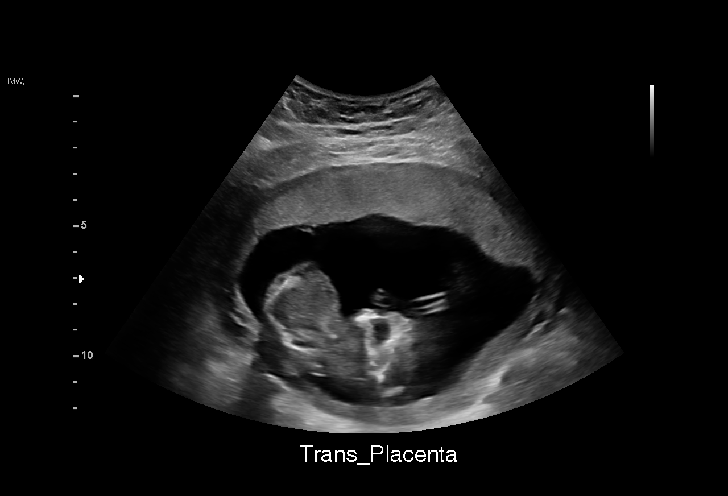
[im 12/27]
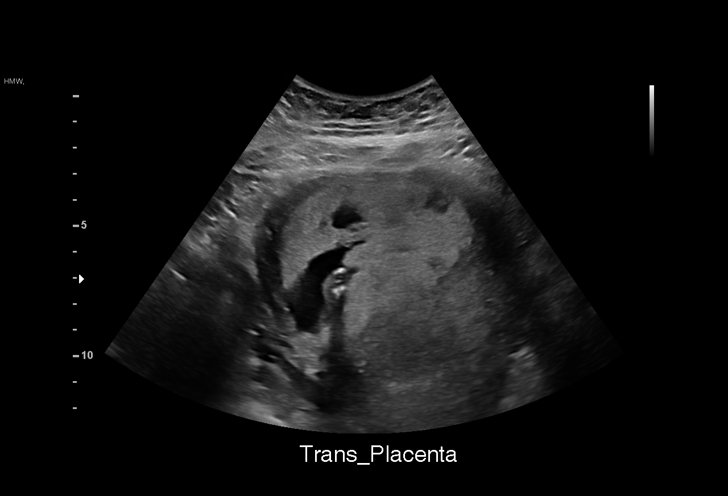
[im 14/27]
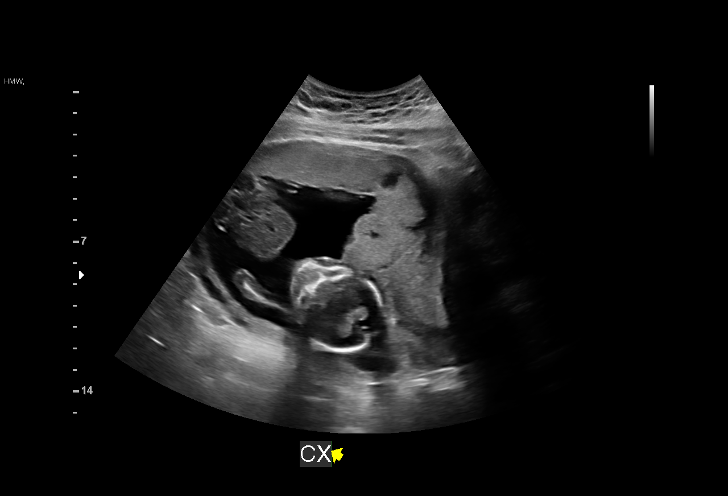
[im 16/27]
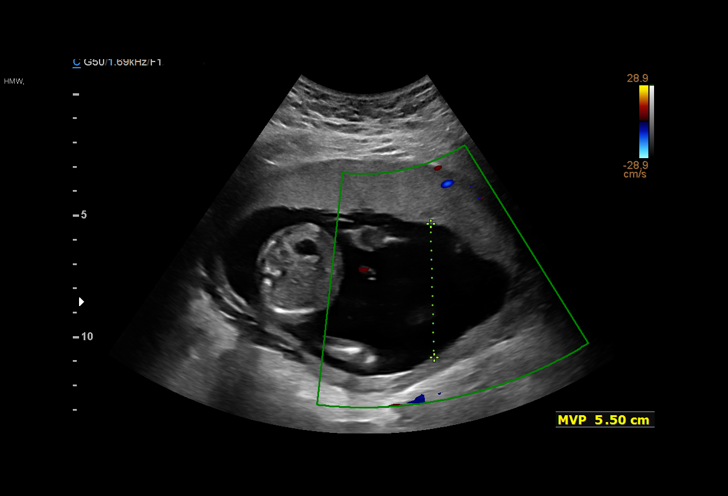
[im 18/27]
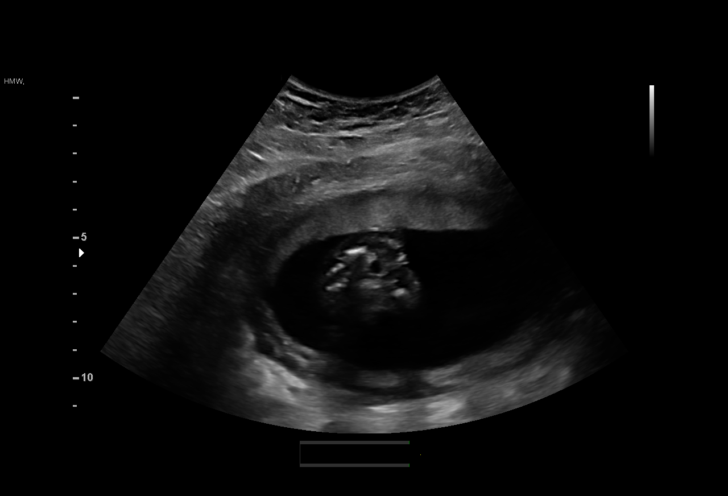
[im 19/27]
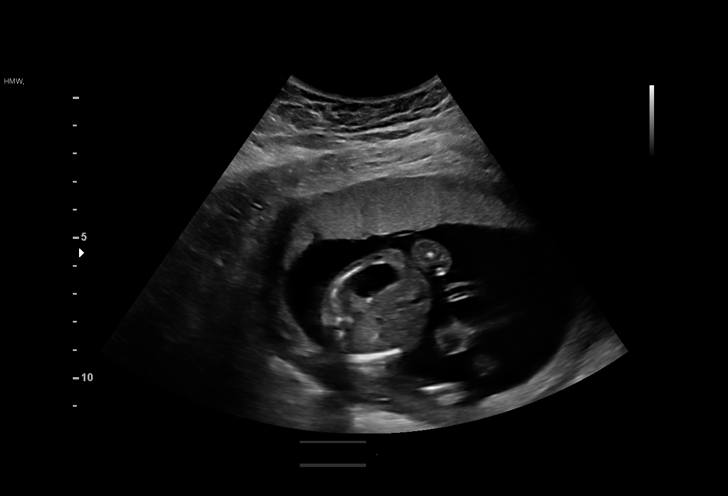
[im 21/27]
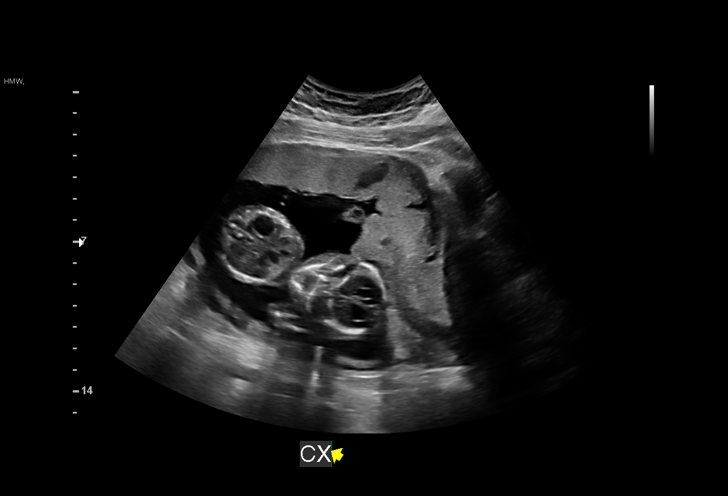
[im 23/27]
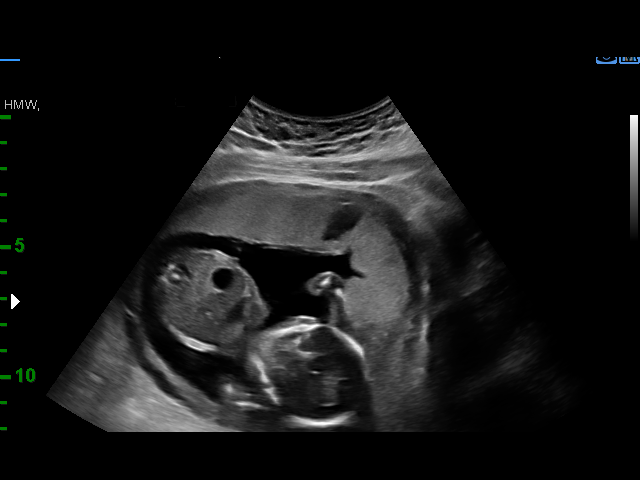
[im 25/27]
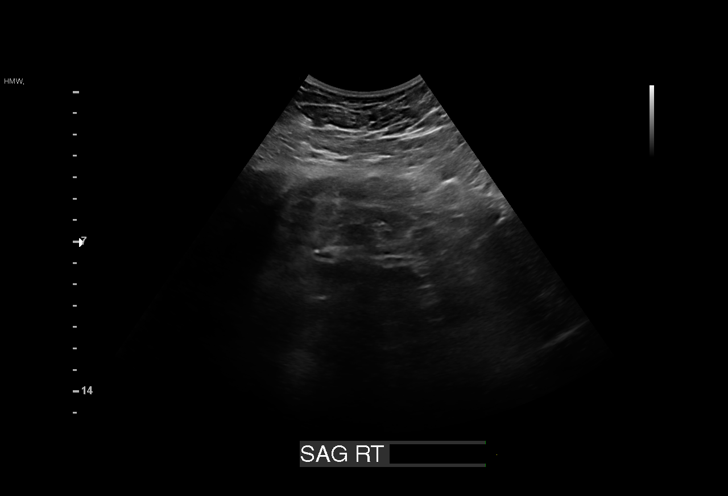
[im 27/27]
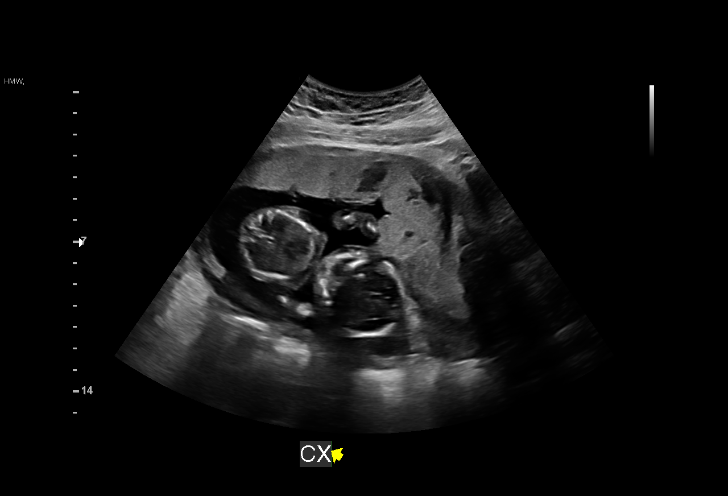

[15 of 27 positions shown; findings below may reference images not displayed]

GARY JOHN

 1  US MFM OB LIMITED                     76815.01    HORTENSIA AGARWAL

Indications

 Vaginal bleeding in pregnancy, second
 trimester
 16 weeks gestation of pregnancy
 Encounter for antenatal screening,
 unspecified
Fetal Evaluation

 Num Of Fetuses:          1
 Fetal Heart Rate(bpm):   144
 Cardiac Activity:        Observed
 Presentation:            Cephalic
 Placenta:                Anterior
 P. Cord Insertion:       Visualized, central

 Amniotic Fluid
 AFI FV:      Within normal limits

                             Largest Pocket(cm)

OB History

 Gravidity:    2         Term:   0        Prem:   0        SAB:   1
 TOP:          0       Ectopic:  0        Living: 0
Gestational Age

 Clinical EDD:  16w 4d                                        EDD:   06/05/22
 Best:          16w 4d     Det. By:  Clinical EDD             EDD:   06/05/22
Anatomy

 Thoracic:              Appears normal         Bladder:                Appears normal
 Stomach:               Appears normal, left
                        sided
Cervix Uterus Adnexa

 Cervix
 Length:            3.7  cm.
 Closed

 Uterus
 No abnormality visualized.

 Right Ovary
 No adnexal mass visualized.

 Left Ovary
 No adnexal mass visualized.

 Cul De Sac
 No free fluid seen.

 Adnexa
 No abnormality visualized.
Comments

 This patient preseneted to the [facility] due to abdominal pain.

 A limited ultrasound performed today shows that the fetus is
 in the vertex presentation.

 There was normal amniotic fluid noted.

 A normal appearing anterior placenta is noted.

## 2022-03-12 MED ORDER — LACTATED RINGERS IV BOLUS
1000.0000 mL | Freq: Once | INTRAVENOUS | Status: AC
Start: 2022-03-12 — End: 2022-03-12
  Administered 2022-03-12: 1000 mL via INTRAVENOUS

## 2022-03-12 MED ORDER — ACETAMINOPHEN 325 MG PO TABS
650.0000 mg | ORAL_TABLET | Freq: Once | ORAL | Status: AC
Start: 2022-03-12 — End: 2022-03-12
  Administered 2022-03-12: 650 mg via ORAL
  Filled 2022-03-12: qty 2
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
## 2022-03-12 NOTE — Progress Notes (Signed)
Cleared by ED.  Transferred to MAU via WC without event. ?

## 2022-03-12 NOTE — Progress Notes (Signed)
IV fluids bolusing and Tylenol given for pain. ?

## 2022-03-12 NOTE — Progress Notes (Signed)
Orthopedic Tech Progress Note ?Patient Details:  ?Peggy [name] ?03/13/2001 ?[id number] ?Level 2 Trauma  ?Patient ID: Peggy [name], female   DOB: 03-19-2001, 21 y.o.   MRN: [id number] ? ?Peggy [name] ?03/12/2022, 1:18 PM ? ?
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
## 2022-03-12 NOTE — ED Notes (Signed)
Ob nurse now taking pt to OB unit. Pt ao x 4, NAD. ?
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
## 2022-03-12 NOTE — Discharge Instructions (Signed)
-  return to MAU if any bleeding, contractions, or concern for baby's movements ?

## 2022-04-10 IMAGING — US US ABDOMEN LIMITED
1 series · 15 of 25 positions shown · non-contrast
Comparison: None.

CLINICAL DATA: Sharp right upper quadrant pain.

EXAM:
ULTRASOUND ABDOMEN LIMITED RIGHT UPPER QUADRANT

[Series 1: us abdomen limited · 49 acquisitions, 15 frames shown]
[im 1/49]
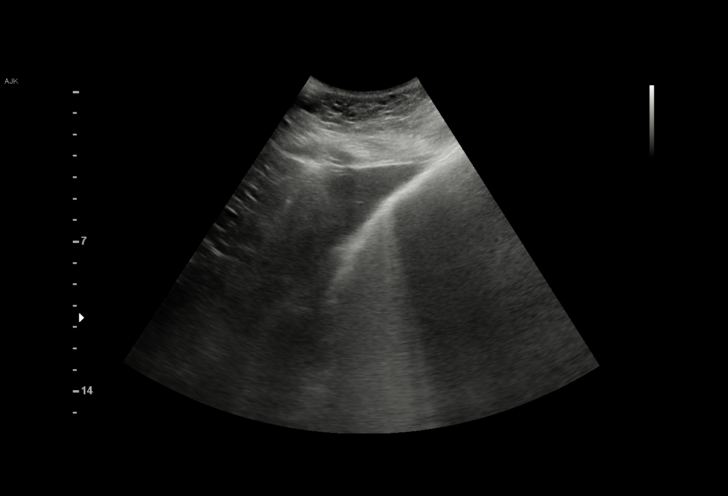
[im 5/49]
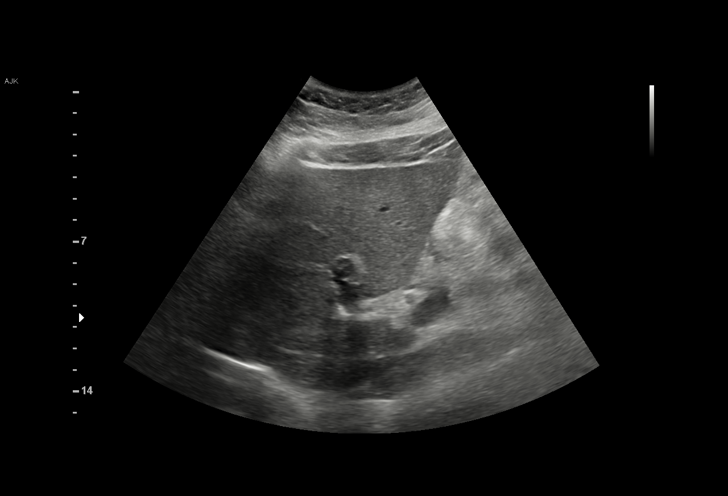
[im 9/49]
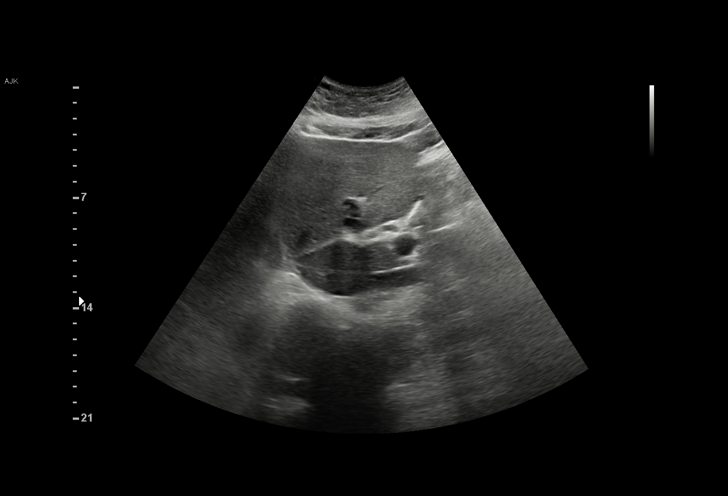
[im 11/49]
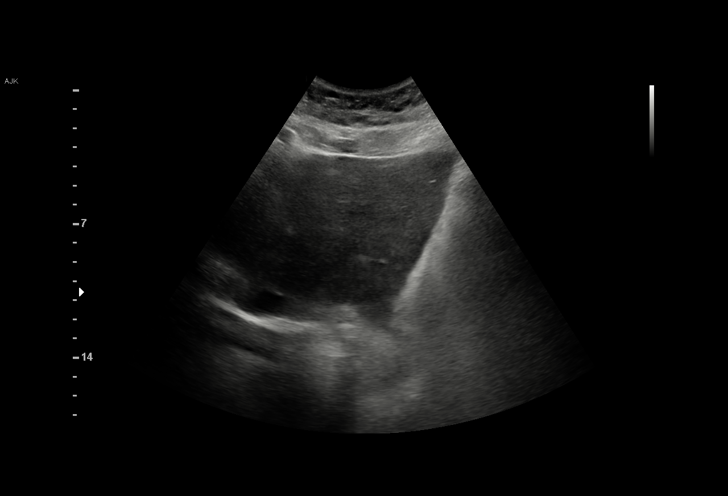
[im 15/49]
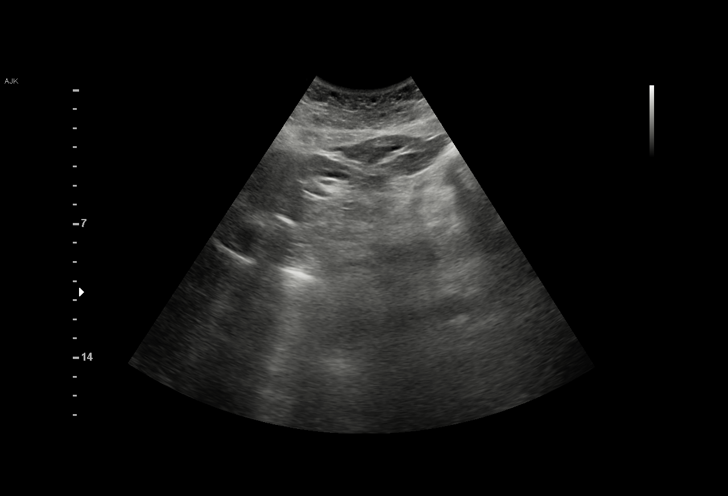
[im 19/49]
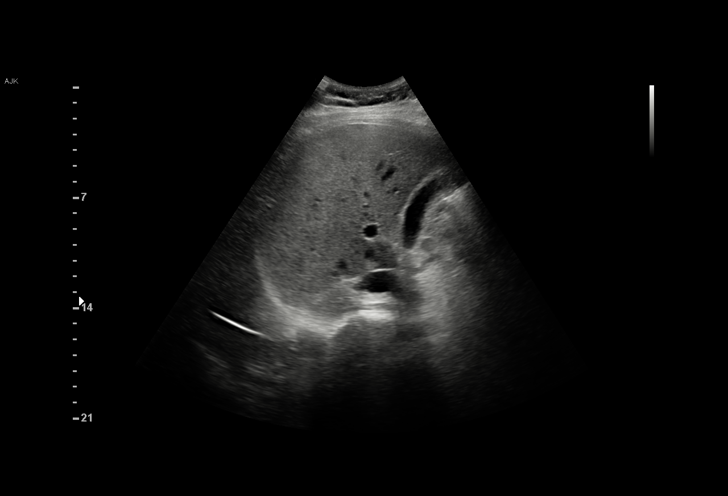
[im 21/49]
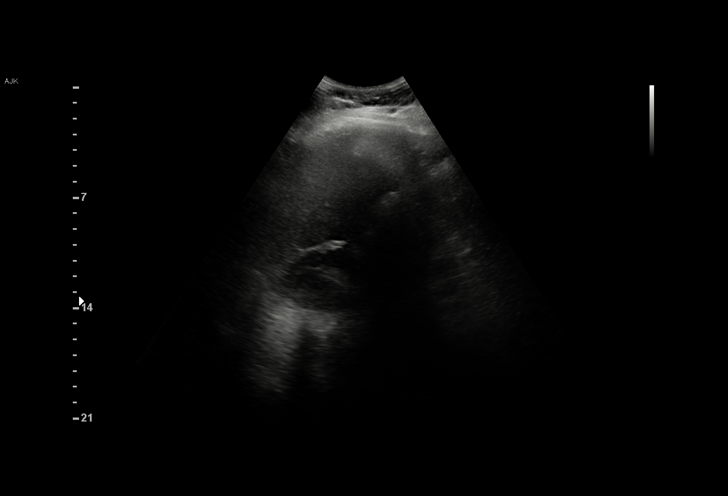
[im 25/49]
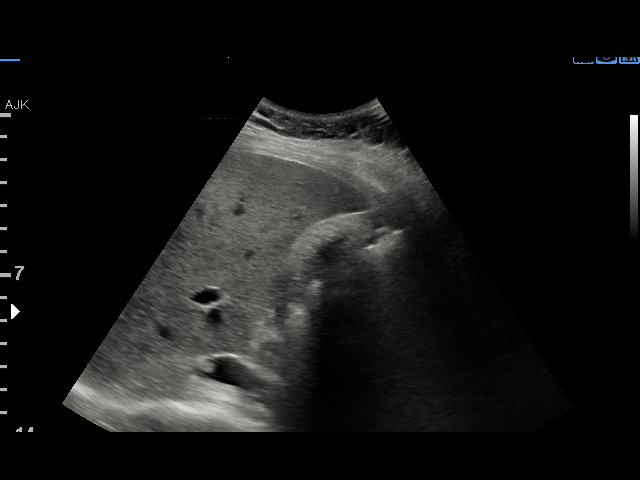
[im 29/49]
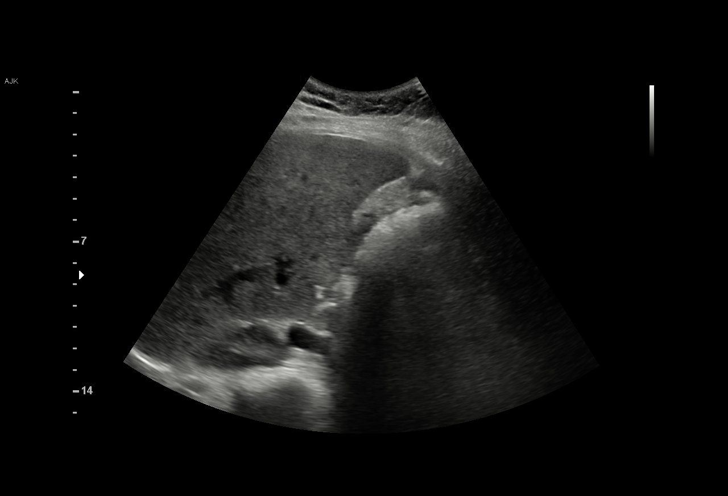
[im 31/49]
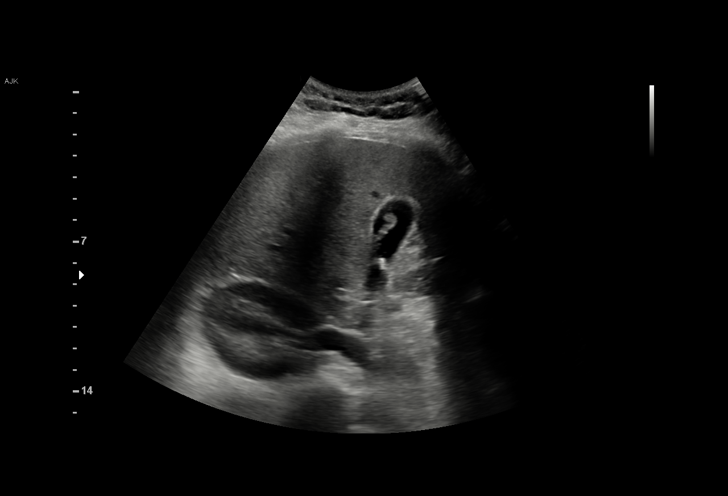
[im 35/49]
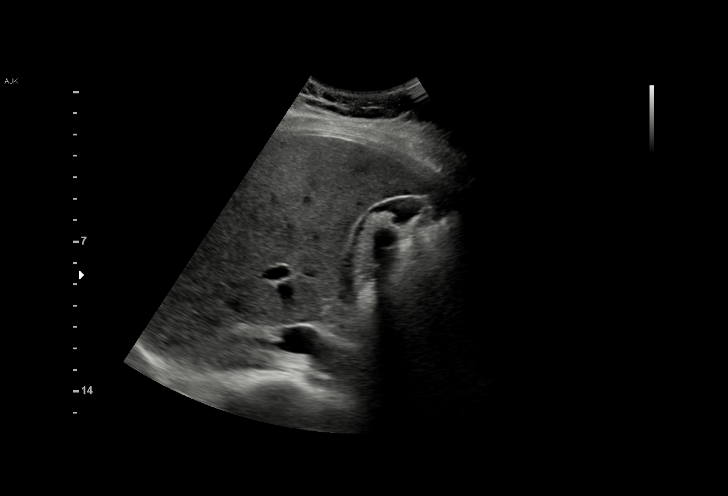
[im 39/49]
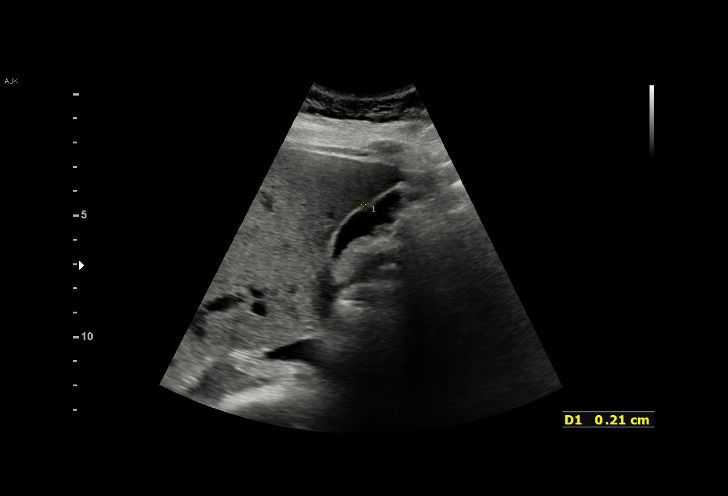
[im 41/49]
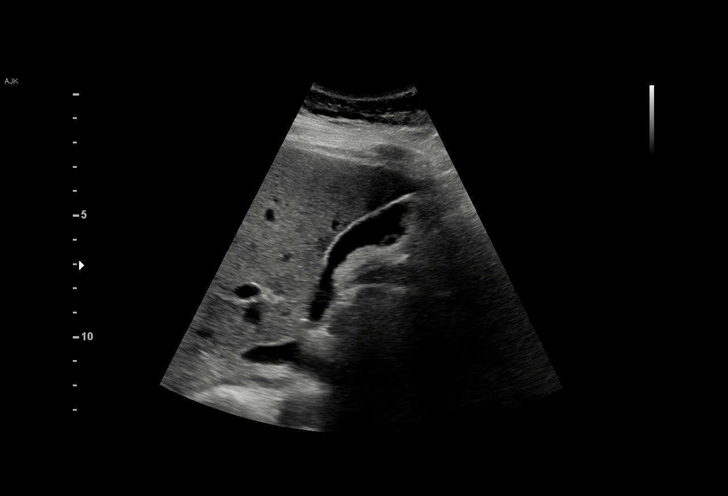
[im 45/49]
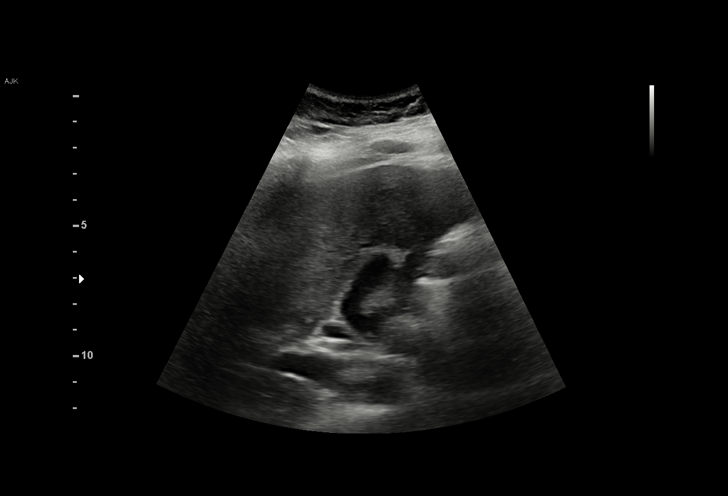
[im 49/49]
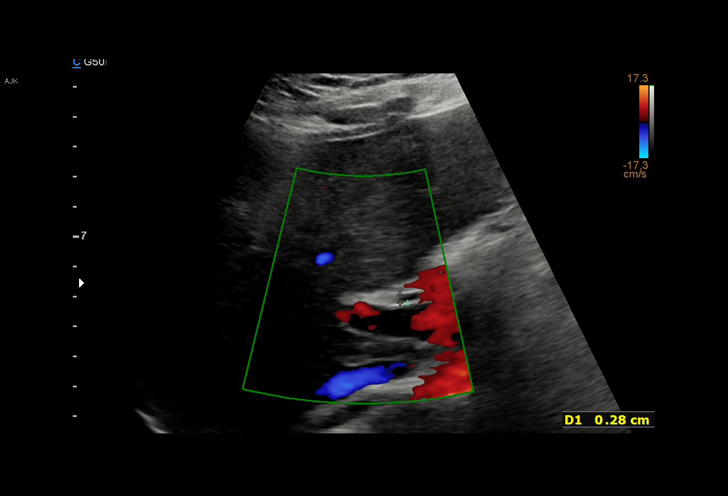

[15 of 25 positions shown; findings below may reference images not displayed]

FINDINGS: Gallbladder:

Shadowing echogenic avascular material is present in the gallbladder
measuring 4.5 x 1.7 cm. No gallbladder wall thickening or
pericholecystic fluid. No sonographic Murphy sign noted by
sonographer.

Common bile duct:

Diameter: 2.9 mm.

Liver:

No focal lesion identified. Mildly increased parenchymal
echogenicity. Portal vein is patent on color Doppler imaging with
normal direction of blood flow towards the liver.

Other: No free fluid
IMPRESSION: .

1. Echogenic, shadowing, avascular material in the gallbladder may
represent stones and/or sludge, less likely mass. No evidence of
acute cholecystitis.
2. Mild hepatic steatosis.

## 2022-04-25 ENCOUNTER — Observation Stay (HOSPITAL_BASED_OUTPATIENT_CLINIC_OR_DEPARTMENT_OTHER): Payer: Medicaid Other

## 2022-04-25 ENCOUNTER — Other Ambulatory Visit: Payer: Self-pay

## 2022-04-25 ENCOUNTER — Encounter (HOSPITAL_COMMUNITY): Payer: Self-pay | Admitting: Obstetrics and Gynecology

## 2022-04-25 ENCOUNTER — Observation Stay (HOSPITAL_COMMUNITY)
Admission: AD | Admit: 2022-04-25 | Discharge: 2022-04-26 | Disposition: A | Discharge status: Home / Self Care | Payer: Medicaid Other | Attending: Obstetrics and Gynecology | Admitting: Obstetrics and Gynecology

## 2022-04-25 DIAGNOSIS — Y92009 Unspecified place in unspecified non-institutional (private) residence as the place of occurrence of the external cause: Secondary | ICD-10-CM | POA: Diagnosis not present

## 2022-04-25 DIAGNOSIS — W19XXXA Unspecified fall, initial encounter: Secondary | ICD-10-CM

## 2022-04-25 DIAGNOSIS — O9A213 Injury, poisoning and certain other consequences of external causes complicating pregnancy, third trimester: Secondary | ICD-10-CM

## 2022-04-25 DIAGNOSIS — M5459 Other low back pain: Secondary | ICD-10-CM

## 2022-04-25 DIAGNOSIS — O26893 Other specified pregnancy related conditions, third trimester: Secondary | ICD-10-CM

## 2022-04-25 DIAGNOSIS — E669 Obesity, unspecified: Secondary | ICD-10-CM | POA: Diagnosis not present

## 2022-04-25 DIAGNOSIS — M545 Low back pain, unspecified: Secondary | ICD-10-CM | POA: Insufficient documentation

## 2022-04-25 DIAGNOSIS — Z3A34 34 weeks gestation of pregnancy: Secondary | ICD-10-CM | POA: Insufficient documentation

## 2022-04-25 DIAGNOSIS — M25559 Pain in unspecified hip: Secondary | ICD-10-CM | POA: Insufficient documentation

## 2022-04-25 DIAGNOSIS — O99213 Obesity complicating pregnancy, third trimester: Secondary | ICD-10-CM | POA: Insufficient documentation

## 2022-04-25 DIAGNOSIS — Z6831 Body mass index (BMI) 31.0-31.9, adult: Secondary | ICD-10-CM | POA: Diagnosis not present

## 2022-04-25 DIAGNOSIS — O99891 Other specified diseases and conditions complicating pregnancy: Secondary | ICD-10-CM | POA: Insufficient documentation

## 2022-04-25 DIAGNOSIS — O99513 Diseases of the respiratory system complicating pregnancy, third trimester: Secondary | ICD-10-CM | POA: Insufficient documentation

## 2022-04-25 DIAGNOSIS — W010XXA Fall on same level from slipping, tripping and stumbling without subsequent striking against object, initial encounter: Secondary | ICD-10-CM | POA: Diagnosis not present

## 2022-04-25 DIAGNOSIS — R1011 Right upper quadrant pain: Secondary | ICD-10-CM | POA: Diagnosis not present

## 2022-04-25 DIAGNOSIS — J45909 Unspecified asthma, uncomplicated: Secondary | ICD-10-CM | POA: Diagnosis not present

## 2022-04-25 LAB — TYPE AND SCREEN
ABO/RH(D): O POS
Antibody Screen: NEGATIVE

## 2022-04-25 MED ORDER — DOCUSATE SODIUM 100 MG PO CAPS
100.0000 mg | ORAL_CAPSULE | Freq: Every day | ORAL | Status: DC
  Administered 2022-04-26: 100 mg via ORAL
  Filled 2022-04-25: qty 1

## 2022-04-25 MED ORDER — CALCIUM CARBONATE ANTACID 500 MG PO CHEW: 2.0000 | CHEWABLE_TABLET | ORAL | Status: DC | PRN

## 2022-04-25 MED ORDER — PRENATAL MULTIVITAMIN CH
1.0000 | ORAL_TABLET | Freq: Every day | ORAL | Status: DC
  Administered 2022-04-26: 1 via ORAL
  Filled 2022-04-25: qty 1

## 2022-04-25 MED ORDER — ACETAMINOPHEN 325 MG PO TABS
650.0000 mg | ORAL_TABLET | ORAL | Status: DC | PRN
  Administered 2022-04-25 – 2022-04-26 (×2): 650 mg via ORAL
  Filled 2022-04-25 (×2): qty 2

## 2022-04-25 NOTE — MAU Note (Signed)
..  Peggy [name] is a 21 y.o. at [redacted]w[redacted]d here in MAU reporting: Fall around 20 mins ago, she fell on her knees and was holding herself up with her hands and then her body weight fell on her abdomen. Reports hip pain and lower back pain, denies ctx. Has not felt movement after fall. Denies vaginal bleeding or leaking of fluid  ? ?Onset of complaint: 20 mins ago ?Pain score: 8/10 ?Vitals:  ? 04/25/22 2102  ?BP: 113/71  ?Resp: 18  ?Temp: 98.2 ?F (36.8 ?C)  ?SpO2: 100%  ?   ?[id number] in room 130's ?Lab orders placed from triage: none ? ?

## 2022-04-25 NOTE — MAU Provider Note (Addendum)
?History  ?  ? ?CSN: [id number] ? ?Arrival date and time: 04/25/22 2044 ? ? Event Date/Time  ? First Provider Initiated Contact with Patient 04/25/22 2206   ?  ? ?Chief Complaint  ?Patient presents with  ? Fall  ? ?HPI ? ?Peggy [name] is 21 y.o. female G2P0010 @ [redacted]w[redacted]d  here in MAU after a fall she experience 1 hour prior to arrival to MAU. She reports her dog got under her feet and she fell onto her knees and then onto her abdomen. Initially she was not feeling her baby move, however now she feels normal movement. She reports constant lower back pain that started after the fall. Her knees are sore, and bruised. No open wounds.  ? ?OB History   ? ? Gravida  ?2  ? Para  ?   ? Term  ?   ? Preterm  ?   ? AB  ?1  ? Living  ?   ?  ? ? SAB  ?1  ? IAB  ?   ? Ectopic  ?   ? Multiple  ?   ? Live Births  ?   ?   ?  ?  ? ? ?Past Medical History:  ?Diagnosis Date  ? Asthma   ? Insulin resistance   ? and oligomenorrhea  ? ? ?Past Surgical History:  ?Procedure Laterality Date  ? NO PAST SURGERIES    ? ? ?Family History  ?Problem Relation Age of Onset  ? Hypertension Mother   ? Hypertension Maternal Grandmother   ? Allergic rhinitis Neg Hx   ? Angioedema Neg Hx   ? Asthma Neg Hx   ? Eczema Neg Hx   ? Immunodeficiency Neg Hx   ? Urticaria Neg Hx   ? ? ?Social History  ? ?Tobacco Use  ? Smoking status: Never  ? Smokeless tobacco: Never  ?Vaping Use  ? Vaping Use: Never used  ?Substance Use Topics  ? Alcohol use: Not Currently  ?  Alcohol/week: 0.0 standard drinks  ? Drug use: No  ? ? ?Allergies: No Known Allergies ? ?Medications Prior to Admission  ?Medication Sig Dispense Refill Last Dose  ? acetaminophen (TYLENOL) 325 MG tablet Take 650 mg by mouth every 6 (six) hours as needed for mild pain.   Past Month  ? calcium carbonate (TUMS - DOSED IN MG ELEMENTAL CALCIUM) 500 MG chewable tablet Chew 500 mg by mouth daily as needed for indigestion or heartburn.   Past Week  ? Prenatal Vit-Fe Fumarate-FA (PREPLUS) 27-1  MG TABS Take 1 tablet by mouth daily.   04/25/2022  ? albuterol (VENTOLIN HFA) 108 (90 Base) MCG/ACT inhaler Inhale 2 puffs into the lungs every 4 (four) hours as needed for wheezing or shortness of breath (coughing fits). 18 g 1 More than a month  ? budesonide-formoterol (SYMBICORT) 160-4.5 MCG/ACT inhaler Inhale 2 puffs into the lungs in the morning and at bedtime. with spacer and rinse mouth afterwards. (Patient not taking: Reported on 03/12/2022) 1 each 5   ? montelukast (SINGULAIR) 10 MG tablet Take 1 tablet (10 mg total) by mouth at bedtime. (Patient not taking: Reported on 03/12/2022) 30 tablet 5   ? ondansetron (ZOFRAN-ODT) 4 MG disintegrating tablet Take 1-2 tablets (4-8 mg total) by mouth every 6 (six) hours as needed for nausea or vomiting. (Patient not taking: Reported on 03/12/2022) 30 tablet 1   ? promethazine (PHENERGAN) 25 MG tablet Take 1 tablet (25 mg total) by mouth every 6 (  six) hours as needed for nausea or vomiting. (Patient not taking: Reported on 03/12/2022) 30 tablet 0   ? ?No results found for this or any previous visit (from the past 48 hour(s)).  ? ?Review of Systems  ?Constitutional:  Negative for fever.  ?Gastrointestinal:  Negative for abdominal pain.  ?Genitourinary:  Negative for flank pain.  ?Musculoskeletal:  Positive for back pain.  ?Physical Exam  ? ?Blood pressure 113/71, temperature 98.2 ?F (36.8 ?C), resp. rate 18, height 5\' 2"  (1.575 m), weight 79 kg, last menstrual period 07/30/2021, SpO2 100 %, unknown if currently breastfeeding. ? ?Physical Exam ?Constitutional:   ?   General: She is not in acute distress. ?   Appearance: Normal appearance. She is not ill-appearing, toxic-appearing or diaphoretic.  ?Abdominal:  ?   Palpations: Abdomen is soft.  ?   Tenderness: There is no abdominal tenderness.  ?Skin: ?   General: Skin is warm.  ?Neurological:  ?   Mental Status: She is alert and oriented to person, place, and time.  ?Psychiatric:     ?   Behavior: Behavior normal.  ? ?Fetal  Tracing: ?Baseline: 125 bpm ?Variability: Moderate  ?Accelerations: 15x15 ?Decelerations: None ?Toco:  Irregular pattern.  ? ?MAU Course  ?Procedures ? ?MDM ? ?O positive blood type ?Discussed patient with Dr. 09/29/2021. Will admit for observation over night. Patient is agreeable with plan of care.  ? ?Assessment and Plan  ? ?A: ? ?1. Fall, initial encounter   ?2. [redacted] weeks gestation of pregnancy   ?3. Acute bilateral low back pain without sciatica   ?  ?P: ? ?Admit to [facility]SC ?Continuous fetal monitoring.  ? ? ?Timothy Lasso, NP ?04/25/2022 ?10:46 PM ? ?

## 2022-04-26 NOTE — Progress Notes (Signed)
Patient seen and examined.  Feeling well this morning.  Felt some mild contractions overnight, but infrequent and they have resolved.  More hip pain from the fall.  Denies bleeding, LOF.  Endorses good FM ? ?BP (!) 93/48 (BP Location: Right Arm)   Pulse 99   Temp 98 ?F (36.7 ?C) (Oral)   Resp 16   Ht 5\' 2"  (1.575 m)   Wt 79 kg   LMP 07/30/2021   SpO2 100%   BMI 31.86 kg/m?  ? ?NAD ?Abdomen: soft, gravid, non-tender ?Ext: trace edema bilaterally ? ?Toco: quiet ?EFM: 130s, moderate variability, + accelerations, no decelerations ? ?A/P:  21 yo G2P0010 @ [redacted]w[redacted]d admitted for observation s/p fall on abdomen ?--Contractions in MAU have stopped, asymptomatic at this tim ?--discussed current recommendation is for 24 hours of observation if contractions are noted s/p abdominal trauma.  If fetal tracing remains reassuring, plan discharge at 2000 today ?

## 2022-04-26 NOTE — Discharge Summary (Signed)
Physician Discharge Summary  ?Patient ID: ?Peggy [name] ?MRN: [id number] ?DOB/AGE: [birth year] 21 y.o. ? ?Admit date: 04/25/2022 ?Discharge date: 04/26/2022 ? ?Admission Diagnoses:  abdominal trauma in pregnancy ? ?Discharge Diagnoses:  ?Principal Problem: ?  Fall ? ? ?Discharged Condition: good ? ?Hospital Course: 21 y.o. female G2P0010 [redacted]w[redacted]d presented to MAU on 04/25/22 after a fall at home around 2000.   ?Was coming inside from outdoors and tripped over her dog. She fell onto her knees initially and then onto the front of her abdomen. She couldn't get up due to pain in her back and hips and had to call her partner to help her. She initially wasn't feeling fetal movement, but once arrived to MAU, able to appreciate movements. She was feeling some pelvic pressure but states she has been feeling this for weeks. No VB or LOF. Denies contractions.  ? ?On fetal monitoring in MAU, contractions were noted, so she was admitted for 24 hour observation.  She was transferred to the antepartum unit for continuous fetal monitoring.  She experienced some mild irregular contractions on the evening of HD#0.  She never experienced vaginal bleeding, leakage of fluid or abdominal pain.  At 1640 (about 20 hours following trauma), patient requested discharge to home.  She felt well and did not desire to wait until 24 hour mark..  She continued to deny painful contractions or abdominal pain.  She was discharged to home with precautions.  ? ? ? ?Discharge Exam: ?Blood pressure 107/64, pulse 100, temperature 98.2 ?F (36.8 ?C), temperature source Oral, resp. rate 16, height 5\' 2"  (1.575 m), weight 79 kg, last menstrual period 07/30/2021, SpO2 100 %, unknown if currently breastfeeding. ?General appearance: alert, cooperative, and appears stated age ?Abdomen: soft, gravid, nontender ? ?Disposition:  ?Discharge disposition: 01-Home or Self Care ? ? ? ? ? ? ?Discharge Instructions   ? ? Discharge activity:  No Restrictions    Complete by: As directed ?  ? Discharge diet:  No restrictions   Complete by: As directed ?  ? Fetal Kick Count:  Lie on our left side for one hour after a meal, and count the number of times your baby kicks.  If it is less than 5 times, get up, move around and drink some juice.  Repeat the test 30 minutes later.  If it is still less than 5 kicks in an hour, notify your doctor.   Complete by: As directed ?  ? No sexual activity restrictions   Complete by: As directed ?  ? Notify physician for a general feeling that "something is not right"   Complete by: As directed ?  ? Notify physician for increase or change in vaginal discharge   Complete by: As directed ?  ? Notify physician for intestinal cramps, with or without diarrhea, sometimes described as "gas pain"   Complete by: As directed ?  ? Notify physician for leaking of fluid   Complete by: As directed ?  ? Notify physician for low, dull backache, unrelieved by heat or Tylenol   Complete by: As directed ?  ? Notify physician for menstrual like cramps   Complete by: As directed ?  ? Notify physician for pelvic pressure   Complete by: As directed ?  ? Notify physician for uterine contractions.  These may be painless and feel like the uterus is tightening or the baby is  "balling up"   Complete by: As directed ?  ? Notify physician for vaginal bleeding   Complete  by: As directed ?  ? PRETERM LABOR:  Includes any of the follwing symptoms that occur between 20 - [redacted] weeks gestation.  If these symptoms are not stopped, preterm labor can result in preterm delivery, placing your baby at risk   Complete by: As directed ?  ? ?  ? ?Allergies as of 04/26/2022   ?No Known Allergies ?  ? ?  ?Medication List  ?  ? ?STOP taking these medications   ? ?budesonide-formoterol 160-4.5 MCG/ACT inhaler ?Commonly known as: Symbicort ?  ?montelukast 10 MG tablet ?Commonly known as: Singulair ?  ?ondansetron 4 MG disintegrating tablet ?Commonly known as: ZOFRAN-ODT ?  ?promethazine 25 MG  tablet ?Commonly known as: PHENERGAN ?  ? ?  ? ?TAKE these medications   ? ?acetaminophen 325 MG tablet ?Commonly known as: TYLENOL ?Take 650 mg by mouth every 6 (six) hours as needed for mild pain. ?  ?albuterol 108 (90 Base) MCG/ACT inhaler ?Commonly known as: Ventolin HFA ?Inhale 2 puffs into the lungs every 4 (four) hours as needed for wheezing or shortness of breath (coughing fits). ?  ?calcium carbonate 500 MG chewable tablet ?Commonly known as: TUMS - dosed in mg elemental calcium ?Chew 500 mg by mouth daily as needed for indigestion or heartburn. ?  ?PrePLUS 27-1 MG Tabs ?Take 1 tablet by mouth daily. ?  ? ?  ? ? ? ?Signed: ?Peggy [name] ?04/26/2022, 4:42 PM ? ? ?

## 2022-04-26 NOTE — Progress Notes (Signed)
Called by RN at approximately 1200.  Patient reported sudden increase in pelvic pressure and LBP.  Felt 2 mild contractions over the last hour.   ? ?Toco: quiet ?EFM: 150s, moderate variability, category 1 ?SVE:  FT/long/posterior/-2 ? ?Will try tylenol and heating pad.  Reviewed preterm labor symptoms.  Continue fetal monitoring ?

## 2022-04-26 NOTE — H&P (Signed)
Peggy [name] is a 21 y.o. female G2P0010 [redacted]w[redacted]d presented to MAU this evening after a fall at home around 2000.   ?Was coming inside from outdoors and tripped over her dog. She fell onto her knees initially and then onto the front of her abdomen. She couldn't get up due to pain in her back and hips and had to call her partner to help her. She initially wasn't feeling fetal movement, but once arrived to MAU, able to appreciate movements. She was feeling some pelvic pressure but states she has been feeling this for weeks. No VB or LOF. Denies contractions.  ? ?Pregnancy c/b: ?Intermittent RUQ pain due to gallbladder sludge noted in 2nd trimester, takes TUMS ?Asthma: stable, not requiring albuterol ?Obesity: initial BMI 31, on ASA ? ?OB History   ? ? Gravida  ?2  ? Para  ?   ? Term  ?   ? Preterm  ?   ? AB  ?1  ? Living  ?   ?  ? ? SAB  ?1  ? IAB  ?   ? Ectopic  ?   ? Multiple  ?   ? Live Births  ?   ?   ?  ?  ? ?Past Medical History:  ?Diagnosis Date  ? Asthma   ? Insulin resistance   ? and oligomenorrhea  ? ?Past Surgical History:  ?Procedure Laterality Date  ? NO PAST SURGERIES    ? ?Family History: family history includes Hypertension in her maternal grandmother and mother. ?Social History:  reports that she has never smoked. She has never used smokeless tobacco. She reports that she does not currently use alcohol. She reports that she does not use drugs. ? ? ?  ?Maternal Diabetes: No ?Genetic Screening: Normal ?Maternal Ultrasounds/Referrals: Normal ?Fetal Ultrasounds or other Referrals:  None ?Maternal Substance Abuse:  No ?Significant Maternal Medications:  None ?Significant Maternal Lab Results:  None ?Other Comments:  None ? ?Review of Systems Per HPI ?Exam ?Physical Exam  ?  ?Blood pressure 111/60, pulse 89, temperature 97.8 ?F (36.6 ?C), temperature source Oral, resp. rate 18, height 5\' 2"  (1.575 m), weight 79 kg, last menstrual period 07/30/2021, SpO2 100 %, unknown if currently  breastfeeding. ?Gen: NAD, sitting up comfortably in bed ?CVS: normal pulses ?Lungs: nonlabored respirations ?Abd: Gravid abdomen, soft, nontender ?Back: mild lumbar tenderness ?Ext; no calf edema or tenderness ? ?Fetal testing: 135bpm, mod variability, + accels, no decels ?Toco: irregular, initially q 5-6 in MAU, now more spaced out and not felt by patient ? ? ?Prenatal labs: ?ABO, Rh:  ?--/--/O POS (05/05 2235) ?Antibody: NEG (05/05 2235) ?Rubella:   ?RPR:    ?HBsAg:    ?HIV:    ?GBS:    ? ?Assessment/Plan: ?21Y G2P0010 @ [redacted]w[redacted]d, admit for observation after fall onto abdomen ?- Discussed rationale for observation with patient and her partner. Fortunately, her abdomen did not take the brunt of her fall, but given she ultimately landed on her abdomen and was experiencing contractions, safest to keep for observation. Pt and partner understanding and agree with plan.  ?- [place] in MAU, final read pending, by my read: anterior placenta normal appearing, no evidence of placental abruption, AFI 0000000, cephalic ?- Continuous fetal monitoring and tocometry ?- Tylenol given for lower back /hip pain. If no relief, consider dose of flexeril.  ?- Rh positive  ? ?Rowland Lathe ?04/26/2022, 12:36 AM ? ? ? ? ?

## 2022-04-26 NOTE — Progress Notes (Signed)
TC from RN.  Patient feels much improved from earlier.  No contractions.  No vaginal bleeding.  Good FM. She is requesting discharge now.   ? ?We discussed in detail that the recommendation is 24 hour observation following abdominal trauma.  She understands but feels well and wants to go home at this time.  Will place discharge orders.  ?

## 2022-05-15 ENCOUNTER — Encounter (HOSPITAL_COMMUNITY): Payer: Self-pay | Admitting: Obstetrics and Gynecology

## 2022-05-15 ENCOUNTER — Other Ambulatory Visit: Payer: Self-pay

## 2022-05-15 ENCOUNTER — Inpatient Hospital Stay (HOSPITAL_COMMUNITY)
Admission: AD | Admit: 2022-05-15 | Discharge: 2022-05-15 | Disposition: A | Discharge status: Home / Self Care | Payer: Medicaid Other | Attending: Obstetrics and Gynecology | Admitting: Obstetrics and Gynecology

## 2022-05-15 DIAGNOSIS — Z3A37 37 weeks gestation of pregnancy: Secondary | ICD-10-CM | POA: Diagnosis not present

## 2022-05-15 DIAGNOSIS — O368130 Decreased fetal movements, third trimester, not applicable or unspecified: Secondary | ICD-10-CM | POA: Insufficient documentation

## 2022-05-15 DIAGNOSIS — O479 False labor, unspecified: Secondary | ICD-10-CM

## 2022-05-15 NOTE — MAU Note (Signed)
Peggy [name] is a 21 y.o. at [redacted]w[redacted]d here in MAU reporting: ctx's since 1000. Pt reports the pain would last 2-3 seconds every 20 minutes. Pt reports increased to 10 seconds of pain every 10 minutes. Pt reports decrease in intensity since they started.  Denies vaginal bleeding.  Denies LOF.  Reports seeing vaginal mucous.  Reports decreased fetal movement all day today.  Onset of complaint: today  Pain score: 3/10 There were no vitals filed for this visit.    Lab orders placed from triage:  none

## 2022-06-05 ENCOUNTER — Inpatient Hospital Stay (HOSPITAL_COMMUNITY): Admit: 2022-06-05 | Payer: Self-pay

## 2023-08-10 LAB — OB RESULTS CONSOLE GC/CHLAMYDIA
Chlamydia: NEGATIVE
Neisseria Gonorrhea: NEGATIVE

## 2023-09-01 LAB — OB RESULTS CONSOLE ABO/RH: RH Type: POSITIVE

## 2023-09-01 LAB — OB RESULTS CONSOLE RPR: RPR: NONREACTIVE

## 2023-09-01 LAB — OB RESULTS CONSOLE HEPATITIS B SURFACE ANTIGEN: Hepatitis B Surface Ag: NEGATIVE

## 2023-09-01 LAB — OB RESULTS CONSOLE RUBELLA ANTIBODY, IGM: Rubella: IMMUNE

## 2023-09-01 LAB — HEPATITIS C ANTIBODY: HCV Ab: NEGATIVE

## 2023-09-01 LAB — OB RESULTS CONSOLE HIV ANTIBODY (ROUTINE TESTING): HIV: NONREACTIVE

## 2023-09-03 LAB — OB RESULTS CONSOLE ANTIBODY SCREEN: Antibody Screen: NEGATIVE

## 2023-12-23 NOTE — L&D Delivery Note (Signed)
 Delivery Note At 1:00 AM a viable and healthy female was delivered via Vaginal, Spontaneous (Presentation: Middle Occiput Anterior).  APGAR: 9, 9; weight pending .   Placenta status: Spontaneous, Intact.  Cord: 3 vessels   Patient rapidly progressed in labor.  She was 6 cm dilated 40 minutes prior to delivery.  Patient was planning to get epidural when her water spontaneously broke and she precipitously delivered in hands and knees with the assistance of her nurse.  I was in house and called to attend the delivery however upon arrival the baby was already delivered and resting comfortably on the maternal abdomen.  After a little more than a minute the umbilical cord was clamped and cut.  The placenta was delivered spontaneously, intact, with three-vessel cord.  Lacerations required repair.  All sponge, needle, instrument counts were correct.  Mother and baby are doing well following delivery.  Anesthesia: None Episiotomy: None Lacerations: None Suture Repair:  NA Est. Blood Loss (mL): 77  Mom to postpartum.  Baby to Couplet care / Skin to Skin.  [name] 02/22/2024, 1:34 AM

## 2024-02-09 LAB — OB RESULTS CONSOLE GBS: GBS: POSITIVE

## 2024-02-21 ENCOUNTER — Encounter (HOSPITAL_COMMUNITY): Payer: Self-pay

## 2024-02-21 ENCOUNTER — Other Ambulatory Visit: Payer: Self-pay

## 2024-02-21 ENCOUNTER — Inpatient Hospital Stay (HOSPITAL_COMMUNITY)
Admission: AD | Admit: 2024-02-21 | Discharge: 2024-02-23 | DRG: 807 | Disposition: A | Discharge status: Home / Self Care | Attending: Obstetrics and Gynecology | Admitting: Obstetrics and Gynecology

## 2024-02-21 DIAGNOSIS — O368130 Decreased fetal movements, third trimester, not applicable or unspecified: Secondary | ICD-10-CM | POA: Diagnosis present

## 2024-02-21 DIAGNOSIS — Z8249 Family history of ischemic heart disease and other diseases of the circulatory system: Secondary | ICD-10-CM

## 2024-02-21 DIAGNOSIS — O99824 Streptococcus B carrier state complicating childbirth: Secondary | ICD-10-CM | POA: Diagnosis present

## 2024-02-21 DIAGNOSIS — Z3A37 37 weeks gestation of pregnancy: Principal | ICD-10-CM

## 2024-02-21 LAB — CBC WITH DIFFERENTIAL/PLATELET
Abs Immature Granulocytes: 0.01 10*3/uL (ref 0.00–0.07)
Basophils Absolute: 0 10*3/uL (ref 0.0–0.1)
Basophils Relative: 0 %
Eosinophils Absolute: 0 10*3/uL (ref 0.0–0.5)
Eosinophils Relative: 1 %
HCT: 32.6 % — ABNORMAL LOW (ref 36.0–46.0)
Hemoglobin: 10.5 g/dL — ABNORMAL LOW (ref 12.0–15.0)
Immature Granulocytes: 0 %
Lymphocytes Relative: 31 %
Lymphs Abs: 2 10*3/uL (ref 0.7–4.0)
MCH: 25.7 pg — ABNORMAL LOW (ref 26.0–34.0)
MCHC: 32.2 g/dL (ref 30.0–36.0)
MCV: 79.7 fL — ABNORMAL LOW (ref 80.0–100.0)
Monocytes Absolute: 0.5 10*3/uL (ref 0.1–1.0)
Monocytes Relative: 9 %
Neutro Abs: 3.8 10*3/uL (ref 1.7–7.7)
Neutrophils Relative %: 59 %
Platelets: 270 10*3/uL (ref 150–400)
RBC: 4.09 MIL/uL (ref 3.87–5.11)
RDW: 14.5 % (ref 11.5–15.5)
WBC: 6.4 10*3/uL (ref 4.0–10.5)
nRBC: 0 % (ref 0.0–0.2)

## 2024-02-21 LAB — TYPE AND SCREEN
ABO/RH(D): O POS
Antibody Screen: NEGATIVE

## 2024-02-21 MED ORDER — SODIUM CHLORIDE 0.9 % IV SOLN: 5.0000 10*6.[IU] | Freq: Once | INTRAVENOUS | Status: DC

## 2024-02-21 MED ORDER — OXYCODONE-ACETAMINOPHEN 5-325 MG PO TABS: 1.0000 | ORAL_TABLET | ORAL | Status: DC | PRN

## 2024-02-21 MED ORDER — LACTATED RINGERS IV BOLUS
1000.0000 mL | Freq: Once | INTRAVENOUS | Status: AC
Start: 2024-02-21 — End: 2024-02-21
  Administered 2024-02-21: 1000 mL via INTRAVENOUS

## 2024-02-21 MED ORDER — ACETAMINOPHEN 325 MG PO TABS: 650.0000 mg | ORAL_TABLET | ORAL | Status: DC | PRN

## 2024-02-21 MED ORDER — OXYTOCIN BOLUS FROM INFUSION
333.0000 mL | Freq: Once | INTRAVENOUS | Status: AC
  Administered 2024-02-22: 333 mL via INTRAVENOUS

## 2024-02-21 MED ORDER — PENICILLIN G POT IN DEXTROSE 60000 UNIT/ML IV SOLN: 3.0000 10*6.[IU] | INTRAVENOUS | Status: DC

## 2024-02-21 MED ORDER — OXYTOCIN-SODIUM CHLORIDE 30-0.9 UT/500ML-% IV SOLN
2.5000 [IU]/h | INTRAVENOUS | Status: DC
  Filled 2024-02-21: qty 500

## 2024-02-21 MED ORDER — LACTATED RINGERS IV SOLN: 500.0000 mL | INTRAVENOUS | Status: DC | PRN

## 2024-02-21 MED ORDER — SODIUM CHLORIDE 0.9 % IV SOLN
5.0000 10*6.[IU] | Freq: Once | INTRAVENOUS | Status: AC
  Administered 2024-02-21: 5 10*6.[IU] via INTRAVENOUS
  Filled 2024-02-21: qty 5

## 2024-02-21 MED ORDER — OXYCODONE-ACETAMINOPHEN 5-325 MG PO TABS: 2.0000 | ORAL_TABLET | ORAL | Status: DC | PRN

## 2024-02-21 MED ORDER — FENTANYL CITRATE (PF) 100 MCG/2ML IJ SOLN
50.0000 ug | INTRAMUSCULAR | Status: DC | PRN
  Administered 2024-02-22: 100 ug via INTRAVENOUS
  Filled 2024-02-21: qty 2

## 2024-02-21 MED ORDER — LIDOCAINE HCL (PF) 1 % IJ SOLN: 30.0000 mL | INTRAMUSCULAR | Status: DC | PRN

## 2024-02-21 MED ORDER — ONDANSETRON HCL 4 MG/2ML IJ SOLN: 4.0000 mg | Freq: Four times a day (QID) | INTRAMUSCULAR | Status: DC | PRN

## 2024-02-21 MED ORDER — LACTATED RINGERS IV SOLN: INTRAVENOUS | Status: DC

## 2024-02-21 MED ORDER — SOD CITRATE-CITRIC ACID 500-334 MG/5ML PO SOLN: 30.0000 mL | ORAL | Status: DC | PRN

## 2024-02-21 NOTE — MAU Note (Signed)
..  Peggy [name] is a 23 y.o. at [redacted]w[redacted]d here in MAU reporting: last movement was 6pm and has been decreased all day. Reports irregular contractions all day.  GBS+  Pain score: 5/10 Vitals:   02/21/24 2004  BP: 132/76  Pulse: 65  Resp: 19  Temp: 98.1 F (36.7 C)  SpO2: 100%     FHT:160 Lab orders placed from triage: none

## 2024-02-21 NOTE — H&P (Signed)
 Peggy [name] is a 23 y.o. female presenting for painful contractions and decreased fetal movement  23 year old G3 P1-0-1-1 at 37+4 presents to maternity admissions complaining of decreased fetal movement and painful contractions.  Fetal tracing reactive, category 1.  Cervical exam 5/80/-2.  Patient is admitted for labor.  Her pregnancy has been otherwise uncomplicated. OB History     Gravida  3   Para      Term      Preterm      AB  1   Living         SAB  1   IAB      Ectopic      Multiple      Live Births             Past Medical History:  Diagnosis Date   Asthma    Past Surgical History:  Procedure Laterality Date   NO PAST SURGERIES     Family History: family history includes Hypertension in her maternal grandmother and mother. Social History:  reports that she has never smoked. She has never used smokeless tobacco. She reports that she does not currently use alcohol. She reports that she does not use drugs.     Maternal Diabetes: No Genetic Screening: Normal Maternal Ultrasounds/Referrals: Normal Fetal Ultrasounds or other Referrals:  None Maternal Substance Abuse:  No Significant Maternal Medications:  NONE Significant Maternal Lab Results:  Group B Strep positive Number of Prenatal Visits:greater than 3 verified prenatal visits Maternal Vaccinations:TDap and Flu Other Comments:  None  Review of Systems History Dilation: 5 Effacement (%): 80 Station: -2 Exam by:: Peggy Smart RN Blood pressure (!) 142/82, pulse 65, temperature 98.1 F (36.7 C), temperature source Oral, resp. rate 16, height 5\' 1"  (1.549 m), weight 63 kg, SpO2 99%, unknown if currently breastfeeding. Exam Physical Exam  Alert and orient x 3, no apparent distress Gravid, soft Fetal heart rate 120, category 1 tracing Prenatal labs: ABO, Rh: --/--/O POS (03/02 2059) Antibody: NEG (03/02 2059) Rubella:  Immune RPR:   Reactive HBsAg:   Negative HIV:  NOn-  Reactive GBS:   Positive Results for orders placed or performed during the hospital encounter of 02/21/24 (from the past 24 hours)  Type and screen     Status: None   Collection Time: 02/21/24  8:59 PM  Result Value Ref Range   ABO/RH(D) O POS    Antibody Screen NEG    Sample Expiration      02/24/2024,2359 Performed at [facility], [address], [place], [place] [address]   CBC with Differential/Platelet     Status: Abnormal   Collection Time: 02/21/24  8:59 PM  Result Value Ref Range   WBC 6.4 4.0 - 10.5 K/uL   RBC 4.09 3.87 - 5.11 MIL/uL   Hemoglobin 10.5 (L) 12.0 - 15.0 g/dL   HCT 60.4 (L) 54.0 - 98.1 %   MCV 79.7 (L) 80.0 - 100.0 fL   MCH 25.7 (L) 26.0 - 34.0 pg   MCHC 32.2 30.0 - 36.0 g/dL   RDW 19.1 47.8 - 29.5 %   Platelets 270 150 - 400 K/uL   nRBC 0.0 0.0 - 0.2 %   Neutrophils Relative % 59 %   Neutro Abs 3.8 1.7 - 7.7 K/uL   Lymphocytes Relative 31 %   Lymphs Abs 2.0 0.7 - 4.0 K/uL   Monocytes Relative 9 %   Monocytes Absolute 0.5 0.1 - 1.0 K/uL   Eosinophils Relative  1 %   Eosinophils Absolute 0.0 0.0 - 0.5 K/uL   Basophils Relative 0 %   Basophils Absolute 0.0 0.0 - 0.1 K/uL   Immature Granulocytes 0 %   Abs Immature Granulocytes 0.01 0.00 - 0.07 K/uL     Assessment/Plan: 1) admit 2) penicillin for GBS prophylaxis 3) epidural on request 4) AROM when able   Peggy [name] 02/21/2024, 10:13 PM

## 2024-02-21 NOTE — MAU Provider Note (Addendum)
 DFM    S Ms. Peggy [name] is a 23 y.o. G51P0010 pregnant female at [redacted]w[redacted]d who presents to MAU today with complaint of DFM. States felt last movement at 1800 and having irregular contractions all day.  Denies VB, LOF.   Receives care at [facility]. Prenatal records reviewed. Hx of asthma, insulin resistance, hx of bulimia and GBS positive.    Pertinent items noted in HPI and remainder of comprehensive ROS otherwise negative.   O BP 132/76 (BP Location: Right Arm)   Pulse 65   Temp 98.1 F (36.7 C) (Oral)   Resp 19   Ht 5\' 1"  (1.549 m)   Wt 63 kg   SpO2 100%   BMI 26.23 kg/m  Physical Exam Vitals and nursing note reviewed.  Constitutional:      General: She is not in acute distress.    Appearance: Normal appearance. She is not ill-appearing.     Comments: Intermittently painful with contractions  HENT:     Head: Normocephalic and atraumatic.     Right Ear: External ear normal.     Left Ear: External ear normal.     Nose: Nose normal. No congestion.     Mouth/Throat:     Mouth: Mucous membranes are moist.     Pharynx: Oropharynx is clear.  Eyes:     Extraocular Movements: Extraocular movements intact.     Conjunctiva/sclera: Conjunctivae normal.  Cardiovascular:     Rate and Rhythm: Normal rate.  Pulmonary:     Effort: Pulmonary effort is normal. No respiratory distress.  Abdominal:     General: Abdomen is flat. There is no distension.     Tenderness: There is no abdominal tenderness.     Comments: gravid  Musculoskeletal:        General: No swelling. Normal range of motion.     Cervical back: Normal range of motion.  Skin:    General: Skin is warm and dry.  Neurological:     Mental Status: She is alert and oriented to person, place, and time. Mental status is at baseline.     Motor: No weakness.     Gait: Gait normal.  Psychiatric:        Mood and Affect: Mood normal.        Behavior: Behavior normal.   NST: 120bpm, initially minimal  variability but improved to moderate s/p fluids, +accels, no decels, CTX q47mins   MDM: MAU Course:  Pt initially with minimal variability but improved s/p fluids.  Pt still reports DFM as only felt twice since being here.  However, she appears to be in labor with a cervical exam showing 5cm, change from 3cm in clinic.  Having regular CTX q73mins.  Spoke with Dr. Tenny Craw and she agrees to bring patient in for labor.   AP #[redacted] weeks gestation #DFM  Admit for labor.    Hessie Dibble, MD 02/21/2024 8:21 PM

## 2024-02-21 NOTE — MAU Note (Signed)
 Pt to wheelchair and transfer to LD 211

## 2024-02-22 ENCOUNTER — Encounter (HOSPITAL_COMMUNITY): Payer: Self-pay | Admitting: Obstetrics and Gynecology

## 2024-02-22 LAB — CBC
HCT: 29.2 % — ABNORMAL LOW (ref 36.0–46.0)
Hemoglobin: 9.4 g/dL — ABNORMAL LOW (ref 12.0–15.0)
MCH: 25.6 pg — ABNORMAL LOW (ref 26.0–34.0)
MCHC: 32.2 g/dL (ref 30.0–36.0)
MCV: 79.6 fL — ABNORMAL LOW (ref 80.0–100.0)
Platelets: 246 10*3/uL (ref 150–400)
RBC: 3.67 MIL/uL — ABNORMAL LOW (ref 3.87–5.11)
RDW: 14.5 % (ref 11.5–15.5)
WBC: 10.4 10*3/uL (ref 4.0–10.5)
nRBC: 0 % (ref 0.0–0.2)

## 2024-02-22 LAB — RPR: RPR Ser Ql: NONREACTIVE

## 2024-02-22 MED ORDER — AMMONIA AROMATIC IN INHA
RESPIRATORY_TRACT | Status: AC
  Filled 2024-02-22: qty 10

## 2024-02-22 MED ORDER — IBUPROFEN 600 MG PO TABS
600.0000 mg | ORAL_TABLET | Freq: Four times a day (QID) | ORAL | Status: DC
  Administered 2024-02-22 – 2024-02-23 (×6): 600 mg via ORAL
  Filled 2024-02-22 (×6): qty 1

## 2024-02-22 MED ORDER — ONDANSETRON HCL 4 MG/2ML IJ SOLN
4.0000 mg | INTRAMUSCULAR | Status: DC | PRN
  Administered 2024-02-22: 4 mg via INTRAVENOUS
  Filled 2024-02-22: qty 2

## 2024-02-22 MED ORDER — OXYCODONE HCL 5 MG PO TABS
5.0000 mg | ORAL_TABLET | ORAL | Status: DC | PRN
  Administered 2024-02-22: 5 mg via ORAL
  Filled 2024-02-22: qty 1

## 2024-02-22 MED ORDER — LACTATED RINGERS IV SOLN: INTRAVENOUS | Status: DC

## 2024-02-22 MED ORDER — METHYLERGONOVINE MALEATE 0.2 MG PO TABS
0.2000 mg | ORAL_TABLET | ORAL | Status: DC | PRN
  Administered 2024-02-22: 0.2 mg via ORAL
  Filled 2024-02-22 (×2): qty 1

## 2024-02-22 MED ORDER — METHYLERGONOVINE MALEATE 0.2 MG/ML IJ SOLN: 0.2000 mg | Freq: Once | INTRAMUSCULAR | Status: AC

## 2024-02-22 MED ORDER — WITCH HAZEL-GLYCERIN EX PADS: 1.0000 | MEDICATED_PAD | CUTANEOUS | Status: DC | PRN

## 2024-02-22 MED ORDER — PHENYLEPHRINE 80 MCG/ML (10ML) SYRINGE FOR IV PUSH (FOR BLOOD PRESSURE SUPPORT): 80.0000 ug | PREFILLED_SYRINGE | INTRAVENOUS | Status: DC | PRN

## 2024-02-22 MED ORDER — DIPHENHYDRAMINE HCL 25 MG PO CAPS: 25.0000 mg | ORAL_CAPSULE | Freq: Four times a day (QID) | ORAL | Status: DC | PRN

## 2024-02-22 MED ORDER — COCONUT OIL OIL: 1.0000 | TOPICAL_OIL | Status: DC | PRN

## 2024-02-22 MED ORDER — ZOLPIDEM TARTRATE 5 MG PO TABS: 5.0000 mg | ORAL_TABLET | Freq: Every evening | ORAL | Status: DC | PRN

## 2024-02-22 MED ORDER — EPHEDRINE 5 MG/ML INJ: 10.0000 mg | INTRAVENOUS | Status: DC | PRN

## 2024-02-22 MED ORDER — OXYTOCIN-SODIUM CHLORIDE 30-0.9 UT/500ML-% IV SOLN
2.5000 [IU]/h | INTRAVENOUS | Status: DC | PRN
  Administered 2024-02-22: 2.5 [IU]/h via INTRAVENOUS
  Filled 2024-02-22: qty 500

## 2024-02-22 MED ORDER — ONDANSETRON HCL 4 MG PO TABS: 4.0000 mg | ORAL_TABLET | ORAL | Status: DC | PRN

## 2024-02-22 MED ORDER — ACETAMINOPHEN 325 MG PO TABS: 650.0000 mg | ORAL_TABLET | ORAL | Status: DC | PRN

## 2024-02-22 MED ORDER — METHYLERGONOVINE MALEATE 0.2 MG/ML IJ SOLN
INTRAMUSCULAR | Status: AC
  Administered 2024-02-22: 0.2 mg via INTRAMUSCULAR
  Filled 2024-02-22: qty 1

## 2024-02-22 MED ORDER — FENTANYL-BUPIVACAINE-NACL 0.5-0.125-0.9 MG/250ML-% EP SOLN
12.0000 mL/h | EPIDURAL | Status: DC | PRN
  Filled 2024-02-22: qty 250

## 2024-02-22 MED ORDER — OXYCODONE HCL 5 MG PO TABS: 10.0000 mg | ORAL_TABLET | ORAL | Status: DC | PRN

## 2024-02-22 MED ORDER — DIBUCAINE (PERIANAL) 1 % EX OINT: 1.0000 | TOPICAL_OINTMENT | CUTANEOUS | Status: DC | PRN

## 2024-02-22 MED ORDER — TETANUS-DIPHTH-ACELL PERTUSSIS 5-2.5-18.5 LF-MCG/0.5 IM SUSY: 0.5000 mL | PREFILLED_SYRINGE | Freq: Once | INTRAMUSCULAR | Status: DC

## 2024-02-22 MED ORDER — TRANEXAMIC ACID-NACL 1000-0.7 MG/100ML-% IV SOLN: 1000.0000 mg | INTRAVENOUS | Status: AC

## 2024-02-22 MED ORDER — SIMETHICONE 80 MG PO CHEW: 80.0000 mg | CHEWABLE_TABLET | ORAL | Status: DC | PRN

## 2024-02-22 MED ORDER — DIPHENHYDRAMINE HCL 50 MG/ML IJ SOLN: 12.5000 mg | INTRAMUSCULAR | Status: DC | PRN

## 2024-02-22 MED ORDER — SENNOSIDES-DOCUSATE SODIUM 8.6-50 MG PO TABS
2.0000 | ORAL_TABLET | Freq: Every day | ORAL | Status: DC
  Filled 2024-02-22: qty 2

## 2024-02-22 MED ORDER — TRANEXAMIC ACID-NACL 1000-0.7 MG/100ML-% IV SOLN
INTRAVENOUS | Status: AC
Start: 2024-02-22 — End: 2024-02-22
  Administered 2024-02-22: 1000 mg via INTRAVENOUS
  Filled 2024-02-22: qty 100

## 2024-02-22 MED ORDER — LACTATED RINGERS IV SOLN
500.0000 mL | Freq: Once | INTRAVENOUS | Status: AC
  Administered 2024-02-22: 500 mL via INTRAVENOUS

## 2024-02-22 MED ORDER — BENZOCAINE-MENTHOL 20-0.5 % EX AERO: 1.0000 | INHALATION_SPRAY | CUTANEOUS | Status: DC | PRN

## 2024-02-22 MED ORDER — METHYLERGONOVINE MALEATE 0.2 MG/ML IJ SOLN: 0.2000 mg | INTRAMUSCULAR | Status: DC | PRN

## 2024-02-22 MED ORDER — PRENATAL MULTIVITAMIN CH
1.0000 | ORAL_TABLET | Freq: Every day | ORAL | Status: DC
  Administered 2024-02-22 – 2024-02-23 (×2): 1 via ORAL
  Filled 2024-02-22 (×2): qty 1

## 2024-02-22 NOTE — Lactation Note (Signed)
 This note was copied from a baby's chart. Lactation Consultation Note  Patient Name: Peggy [name] [id number] Date: 02/22/2024 Age:25 hours  Reason for consult: Initial assessment;Early term 37-38.6wks;Infant < 6lbs;Breastfeeding assistance  P2, [redacted]w[redacted]d, ETI, 5lbs 13oz/ 2690 g  Initial LC visit to see P2 mother and baby Peggy "Jose III". Mother reports  the she breastfed her daughter with much success and she produced plenty of breast milk. Mother says baby has been latching good but some feedings he falls asleep. Baby is getting formula, Neosure 22 kcal in addition to breastfeeding.   Mother has lots of colostrum. She was receptive to assist with breastfeeding and baby latched with ease. He feed well with flanged lips, wide gape and audible swallows. Baby fed for 20 min. Mother's breast are filling. DEBP set up for mother to express colostrum and supplement baby as needed.   Instructed to call for assistance as needed. Instructed mother on the use, frequency, cleaning of breast pump and storage of breast milk. CDC guidelines left with parents.   Mom made aware of O/P services, breastfeeding support groups, and our phone # for post-discharge questions.     Maternal Data Has patient been taught Hand Expression?: Yes Does the patient have breastfeeding experience prior to this delivery?: Yes How long did the patient breastfeed?: 18 months (child is now 7 yo)  Feeding Mother's Current Feeding Choice: Breast Milk and Formula Nipple Type: Slow - flow  LATCH Score Latch: Grasps breast easily, tongue down, lips flanged, rhythmical sucking.  Audible Swallowing: Spontaneous and intermittent  Type of Nipple: Everted at rest and after stimulation  Comfort (Breast/Nipple): Soft / non-tender  Hold (Positioning): Assistance needed to correctly position infant at breast and maintain latch.  LATCH Score: 9   Lactation Tools Discussed/Used Tools: Pump;Flanges Flange Size: 21  (mother will also try 18 mm flange, will use the most comfortable) Breast pump type: Double-Electric Breast Pump;Manual Pump Education: Setup, frequency, and cleaning;Milk Storage Reason for Pumping: ETI Pumping frequency: for breast fullness, supplementation  Interventions Interventions: Breast feeding basics reviewed;Assisted with latch;Hand express;Support pillows;Adjust position;Breast compression;DEBP;Education;LC Services brochure;CDC milk storage guidelines;CDC Guidelines for Breast Pump Cleaning  Discharge Pump: Personal;DEBP (Spectra)  Consult Status Consult Status: Follow-up Date: 02/23/24 Follow-up type: In-patient    Christella Hartigan M 02/22/2024, 6:49 PM

## 2024-02-22 NOTE — Progress Notes (Signed)
 Post Partum Day 0 Subjective: Patient is doing well this morning. Pain is controlled. Ambulating, voiding, tolerating PO. Minimal lochia.  Received methergine and TXA postpartum for increased bleeding ( documented in I/O chart as EBL) with good response.   Objective: Patient Vitals for the past 24 hrs:  BP Temp Temp src Pulse Resp SpO2 Height Weight  02/22/24 0958 (!) 104/55 98.4 F (36.9 C) Oral 70 16 99 % -- --  02/22/24 0700 113/69 -- -- 66 16 -- -- --  02/22/24 0500 136/86 98.6 F (37 C) Oral 64 16 100 % -- --  02/22/24 0415 (!) 148/80 -- -- 65 -- -- -- --  02/22/24 0410 139/84 -- -- 66 -- -- -- --  02/22/24 0355 116/77 -- -- 63 -- -- -- --  02/22/24 0345 (!) 111/95 -- -- 95 -- -- -- --  02/22/24 0340 104/74 -- -- (!) 130 -- -- -- --  02/22/24 0307 135/74 97.9 F (36.6 C) Oral 64 16 100 % -- --  02/22/24 0218 (!) 144/82 -- -- 66 16 -- -- --  02/22/24 0201 134/79 -- -- 63 -- -- -- --  02/22/24 0145 (!) 149/82 -- -- 64 -- -- -- --  02/22/24 0130 (!) 130/96 -- -- 66 -- -- -- --  02/22/24 0119 (!) 141/93 98.1 F (36.7 C) Oral 68 17 -- -- --  02/22/24 0027 133/63 (!) 97.1 F (36.2 C) Axillary 72 18 -- -- --  02/21/24 2334 (!) 135/59 -- -- (!) 59 17 -- -- --  02/21/24 2154 (!) 142/82 98.1 F (36.7 C) Oral 65 16 -- -- --  02/21/24 2130 -- -- -- -- -- 99 % -- --  02/21/24 2100 -- -- -- -- -- 99 % -- --  02/21/24 2030 -- -- -- -- -- 99 % -- --  02/21/24 2025 -- -- -- -- -- 100 % -- --  02/21/24 2023 123/76 -- -- 62 -- -- -- --  02/21/24 2004 132/76 98.1 F (36.7 C) Oral 65 19 100 % 5\' 1"  (1.549 m) --  02/21/24 2001 -- -- -- -- -- -- -- 63 kg    Physical Exam:  General: alert, cooperative, and no distress Lochia: appropriate Uterine Fundus: firm DVT Evaluation: No evidence of DVT seen on physical exam.  Recent Labs    02/21/24 2059 02/22/24 0424  WBC 6.4 10.4  HGB 10.5* 9.4*  HCT 32.6* 29.2*  PLT 270 246     Assessment/Plan: [name]  22 y.o. G3P1011 PPD#0 sp SVD 1. PPC: routine PP care 2. Increased PP blood loss: QBL , hgb 9.4 this AM. Patient is asymptomatic.  Will monitor.  3. Rh pos   LOS: 1 day   Charlett Nose 02/22/2024, 10:36 AM

## 2024-02-22 NOTE — Progress Notes (Signed)
 Spoke with Dr. Timothy Lasso about Postpartum progress for patient. Dr. Timothy Lasso stated to discontinue LR and Pitocin at this time and to move to PRN for the methergine if patient starts to have increased bleeding. Will continue to monitor and reach out to MD as needed.   Cindi Carbon, RN

## 2024-02-23 LAB — BIRTH TISSUE RECOVERY COLLECTION (PLACENTA DONATION)

## 2024-02-23 MED ORDER — IBUPROFEN 600 MG PO TABS: 600.0000 mg | ORAL_TABLET | Freq: Four times a day (QID) | ORAL | 1 refills | Status: DC | PRN

## 2024-02-23 NOTE — Discharge Summary (Signed)
 Postpartum Discharge Summary  Date of Service updated      Patient Name: Peggy [name] DOB: 11-May-2001 MRN: [id number]  Date of admission: 02/21/2024 Delivery date:02/22/2024 Delivering provider: [name] Date of discharge: 02/23/2024  Admitting diagnosis: Normal labor [O80, Z37.9] Spontaneous vaginal delivery [O80] Intrauterine pregnancy: [redacted]w[redacted]d     Secondary diagnosis:  Principal Problem:   Normal labor Active Problems:   Spontaneous vaginal delivery  Additional problems: none    Discharge diagnosis: Term Pregnancy Delivered                                              Post partum procedures: n/a Augmentation: N/A Complications: None  Hospital course: Onset of Labor With Vaginal Delivery      23 y.o. yo G3P1011 at [redacted]w[redacted]d was admitted in Active Labor on 02/21/2024. Labor course was complicated by n/a  Membrane Rupture Time/Date: 12:58 AM,02/21/2024  Delivery Method:Vaginal, Spontaneous Operative Delivery:N/A Episiotomy: None Lacerations:  None Patient had a postpartum course complicated by  n/a.  She is ambulating, tolerating a regular diet, passing flatus, and urinating well. Patient is discharged home in stable condition on 02/23/24.  Newborn Data: Birth date:02/22/2024 Birth time:1:00 AM Gender:Female Living status:Living Apgars:9 ,9  Weight:2690 g  Magnesium Sulfate received: No BMZ received: No Rhophylac:N/A MMR:N/A T-DaP:Given prenatally Flu: Yes RSV Vaccine received: No Transfusion:No Immunizations administered: Immunization History  Administered Date(s) Administered   Tdap 09/12/2010    Physical exam  Vitals:   02/22/24 1442 02/22/24 1814 02/22/24 2035 02/23/24 0523  BP: 112/76 107/65 115/70 108/74  Pulse: 61 76    Resp: 18 18 16 18   Temp: 98.4 F (36.9 C) 98 F (36.7 C) 98.2 F (36.8 C) 98 F (36.7 C)  TempSrc: Oral Oral Oral Oral  SpO2: 100% 98% 99% 99%  Weight:      Height:       General: alert, cooperative, and no  distress Lochia: appropriate Uterine Fundus: firm Incision: N/A DVT Evaluation: No evidence of DVT seen on physical exam. Labs: Lab Results  Component Value Date   WBC 10.4 02/22/2024   HGB 9.4 (L) 02/22/2024   HCT 29.2 (L) 02/22/2024   MCV 79.6 (L) 02/22/2024   PLT 246 02/22/2024      Latest Ref Rng & Units 03/12/2022    1:01 PM  CMP  Glucose 70 - 99 mg/dL 82   BUN 6 - 20 mg/dL <5   Creatinine 4.09 - 1.00 mg/dL 8.11   Sodium 914 - 782 mmol/L 136   Potassium 3.5 - 5.1 mmol/L 3.7   Chloride 98 - 111 mmol/L 109   CO2 22 - 32 mmol/L 19   Calcium 8.9 - 10.3 mg/dL 9.0   Total Protein 6.5 - 8.1 g/dL 6.6   Total Bilirubin 0.3 - 1.2 mg/dL 1.0   Alkaline Phos 38 - 126 U/L 64   AST 15 - 41 U/L 15   ALT 0 - 44 U/L 11    Edinburgh Score:    02/23/2024    9:42 AM  Edinburgh Postnatal Depression Scale Screening Tool  I have been able to laugh and see the funny side of things. 0  I have looked forward with enjoyment to things. 0  I have blamed myself unnecessarily when things went wrong. 0  I have been anxious or worried for no good reason. 0  I have felt scared or panicky for no good reason. 0  Things have been getting on top of me. 0  I have been so unhappy that I have had difficulty sleeping. 0  I have felt sad or miserable. 0  I have been so unhappy that I have been crying. 0  The thought of harming myself has occurred to me. 0  Edinburgh Postnatal Depression Scale Total 0      After visit meds:  Allergies as of 02/23/2024   No Known Allergies      Medication List     TAKE these medications    acetaminophen 325 MG tablet Commonly known as: TYLENOL Take 650 mg by mouth every 6 (six) hours as needed for mild pain.   albuterol 108 (90 Base) MCG/ACT inhaler Commonly known as: Ventolin HFA Inhale 2 puffs into the lungs every 4 (four) hours as needed for wheezing or shortness of breath (coughing fits).   calcium carbonate 500 MG chewable tablet Commonly known as:  TUMS - dosed in mg elemental calcium Chew 500 mg by mouth daily as needed for indigestion or heartburn.   ibuprofen 600 MG tablet Commonly known as: ADVIL Take 1 tablet (600 mg total) by mouth every 6 (six) hours as needed for cramping or moderate pain (pain score 4-6).   PrePLUS 27-1 MG Tabs Take 1 tablet by mouth daily.         Discharge home in stable condition Infant Feeding: Breast Infant Disposition:home with mother Discharge instruction: per After Visit Summary and Postpartum booklet. Activity: Advance as tolerated. Pelvic rest for 6 weeks.  Diet: routine diet Anticipated Birth Control: Unsure Postpartum Appointment:6 weeks Additional Postpartum F/U: Postpartum Depression checkup Future Appointments:No future appointments. Follow up Visit:  Follow-up Information     Ob/Gyn, Nestor Ramp Follow up in 6 week(s).   Why: For postpartum visit Contact information: [address] [place] [address] [phone]                     02/23/2024 Cathrine Muster, DO

## 2024-02-23 NOTE — Lactation Note (Signed)
 This note was copied from a baby's chart. Lactation Consultation Note  Patient Name: Peggy [name] [id number] Date: 02/23/2024 Age:25 hours  Reason for consult: Follow-up assessment;Infant < 6lbs;Early term 37-38.6wks  P2, [redacted]w[redacted]d, 5% weight loss  Mother states her milk is in and baby is breastfeeding well. She is an experienced breastfeeding mother and is aware of engorgement and management. She is also planning to pump after breastfeeding to collect her milk, as needed, until baby can accommodate her milk production.  Mother in good spirits and denies any questions or concerns. Mother has resource information for [facility] lactation support services.       Feeding Mother's Current Feeding Choice: Breast Milk and Formula  LATCH Score Latch: Grasps breast easily, tongue down, lips flanged, rhythmical sucking.  Audible Swallowing: Spontaneous and intermittent  Type of Nipple: Everted at rest and after stimulation  Comfort (Breast/Nipple): Filling, red/small blisters or bruises, mild/mod discomfort  Hold (Positioning): No assistance needed to correctly position infant at breast.  LATCH Score: 9     Interventions Interventions: Education  Discharge Discharge Education: Engorgement and breast care;Warning signs for feeding baby Pump: Personal;DEBP  Consult Status Consult Status: Complete Date: 02/23/24    Peggy [name] 02/23/2024, 5:51 PM

## 2024-02-23 NOTE — Discharge Instructions (Signed)
 Call office with any concerns [phone]

## 2024-02-23 NOTE — Progress Notes (Signed)
 Post Partum Day 1 Subjective: no complaints, up ad lib, voiding, tolerating PO, + flatus, and lochia mild - rubra. She denies CP, SOB or HA. She is bonding well with baby   She would like discharge to home today if baby cleared   Objective: Blood pressure 108/74, pulse 76, temperature 98 F (36.7 C), temperature source Oral, resp. rate 18, height 5\' 1"  (1.549 m), weight 63 kg, SpO2 99%, unknown if currently breastfeeding.  Physical Exam:  General: alert, cooperative, and no distress Lochia: appropriate Uterine Fundus: firm Incision: n/a DVT Evaluation: No evidence of DVT seen on physical exam.  Recent Labs    02/21/24 2059 02/22/24 0424  HGB 10.5* 9.4*  HCT 32.6* 29.2*    Assessment/Plan: Breastfeeding Does not desire circumcision Reviewed discharge instructions - if baby cleared, pt ok to d/c to home. Rx sent to pharmacy   LOS: 2 days   Janean Sark Titus Drone, DO 02/23/2024, 10:10 AM

## 2024-02-27 ENCOUNTER — Inpatient Hospital Stay (HOSPITAL_COMMUNITY)

## 2024-02-27 ENCOUNTER — Ambulatory Visit
Admission: EM | Admit: 2024-02-27 | Discharge: 2024-02-27 | Disposition: A | Discharge status: Home / Self Care | Attending: Family Medicine | Admitting: Family Medicine

## 2024-02-27 ENCOUNTER — Other Ambulatory Visit: Payer: Self-pay

## 2024-02-27 ENCOUNTER — Inpatient Hospital Stay (HOSPITAL_COMMUNITY)
Admission: AD | Admit: 2024-02-27 | Discharge: 2024-02-29 | DRG: 776 | Disposition: A | Discharge status: Home / Self Care | Attending: Obstetrics and Gynecology | Admitting: Obstetrics and Gynecology

## 2024-02-27 ENCOUNTER — Encounter (HOSPITAL_COMMUNITY): Payer: Self-pay | Admitting: Obstetrics and Gynecology

## 2024-02-27 DIAGNOSIS — I428 Other cardiomyopathies: Secondary | ICD-10-CM | POA: Diagnosis not present

## 2024-02-27 DIAGNOSIS — R0682 Tachypnea, not elsewhere classified: Secondary | ICD-10-CM | POA: Diagnosis present

## 2024-02-27 DIAGNOSIS — R0602 Shortness of breath: Secondary | ICD-10-CM | POA: Diagnosis present

## 2024-02-27 DIAGNOSIS — J810 Acute pulmonary edema: Secondary | ICD-10-CM

## 2024-02-27 DIAGNOSIS — O903 Peripartum cardiomyopathy: Principal | ICD-10-CM | POA: Diagnosis present

## 2024-02-27 DIAGNOSIS — O165 Unspecified maternal hypertension, complicating the puerperium: Secondary | ICD-10-CM

## 2024-02-27 DIAGNOSIS — O1495 Unspecified pre-eclampsia, complicating the puerperium: Principal | ICD-10-CM | POA: Diagnosis present

## 2024-02-27 DIAGNOSIS — Z79899 Other long term (current) drug therapy: Secondary | ICD-10-CM | POA: Diagnosis not present

## 2024-02-27 DIAGNOSIS — J811 Chronic pulmonary edema: Secondary | ICD-10-CM | POA: Diagnosis present

## 2024-02-27 DIAGNOSIS — R079 Chest pain, unspecified: Secondary | ICD-10-CM

## 2024-02-27 DIAGNOSIS — O1205 Gestational edema, complicating the puerperium: Secondary | ICD-10-CM

## 2024-02-27 DIAGNOSIS — R0601 Orthopnea: Secondary | ICD-10-CM

## 2024-02-27 DIAGNOSIS — Z8249 Family history of ischemic heart disease and other diseases of the circulatory system: Secondary | ICD-10-CM

## 2024-02-27 LAB — COMPREHENSIVE METABOLIC PANEL
ALT: 17 U/L (ref 0–44)
AST: 20 U/L (ref 15–41)
Albumin: 2.4 g/dL — ABNORMAL LOW (ref 3.5–5.0)
Alkaline Phosphatase: 122 U/L (ref 38–126)
Anion gap: 9 (ref 5–15)
BUN: 7 mg/dL (ref 6–20)
CO2: 20 mmol/L — ABNORMAL LOW (ref 22–32)
Calcium: 8.3 mg/dL — ABNORMAL LOW (ref 8.9–10.3)
Chloride: 111 mmol/L (ref 98–111)
Creatinine, Ser: 0.41 mg/dL — ABNORMAL LOW (ref 0.44–1.00)
GFR, Estimated: >60 mL/min (ref >60)
Glucose, Bld: 84 mg/dL (ref 70–99)
Potassium: 3.9 mmol/L (ref 3.5–5.1)
Sodium: 140 mmol/L (ref 135–145)
Total Bilirubin: 0.8 mg/dL (ref 0.0–1.2)
Total Protein: 5.8 g/dL — ABNORMAL LOW (ref 6.5–8.1)

## 2024-02-27 LAB — URINALYSIS, ROUTINE W REFLEX MICROSCOPIC
Bacteria, UA: NONE SEEN
Bilirubin Urine: NEGATIVE
Glucose, UA: NEGATIVE mg/dL
Ketones, ur: NEGATIVE mg/dL
Nitrite: NEGATIVE
Protein, ur: NEGATIVE mg/dL
Specific Gravity, Urine: 1.018 (ref 1.005–1.030)
WBC, UA: >50 WBC/hpf (ref 0–5)
pH: 6 (ref 5.0–8.0)

## 2024-02-27 LAB — ECHOCARDIOGRAM COMPLETE
Area-P 1/2: 3.85 cm2
Calc EF: 46.4 %
S' Lateral: 3.4 cm
Single Plane A2C EF: 40.5 %
Single Plane A4C EF: 53.9 %

## 2024-02-27 LAB — CBC WITH DIFFERENTIAL/PLATELET
Abs Immature Granulocytes: 0.04 10*3/uL (ref 0.00–0.07)
Basophils Absolute: 0 10*3/uL (ref 0.0–0.1)
Basophils Relative: 0 %
Eosinophils Absolute: 0.1 10*3/uL (ref 0.0–0.5)
Eosinophils Relative: 1 %
HCT: 23.8 % — ABNORMAL LOW (ref 36.0–46.0)
Hemoglobin: 7.8 g/dL — ABNORMAL LOW (ref 12.0–15.0)
Immature Granulocytes: 1 %
Lymphocytes Relative: 17 %
Lymphs Abs: 1.1 10*3/uL (ref 0.7–4.0)
MCH: 26.4 pg (ref 26.0–34.0)
MCHC: 32.8 g/dL (ref 30.0–36.0)
MCV: 80.7 fL (ref 80.0–100.0)
Monocytes Absolute: 0.3 10*3/uL (ref 0.1–1.0)
Monocytes Relative: 5 %
Neutro Abs: 5.1 10*3/uL (ref 1.7–7.7)
Neutrophils Relative %: 76 %
Platelets: 329 10*3/uL (ref 150–400)
RBC: 2.95 MIL/uL — ABNORMAL LOW (ref 3.87–5.11)
RDW: 15.8 % — ABNORMAL HIGH (ref 11.5–15.5)
WBC: 6.7 10*3/uL (ref 4.0–10.5)
nRBC: 0.3 % — ABNORMAL HIGH (ref 0.0–0.2)

## 2024-02-27 LAB — CREATININE, URINE, RANDOM: Creatinine, Urine: 42 mg/dL

## 2024-02-27 LAB — TROPONIN I (HIGH SENSITIVITY)
Troponin I (High Sensitivity): 24 ng/L — ABNORMAL HIGH (ref <18)
Troponin I (High Sensitivity): 18 ng/L — ABNORMAL HIGH (ref <18)
Troponin I (High Sensitivity): 12 ng/L
Troponin I (High Sensitivity): 11 ng/L (ref <18)

## 2024-02-27 LAB — BRAIN NATRIURETIC PEPTIDE: B Natriuretic Peptide: 739.8 pg/mL — ABNORMAL HIGH (ref 0.0–100.0)

## 2024-02-27 MED ORDER — ONDANSETRON HCL 4 MG PO TABS: 4.0000 mg | ORAL_TABLET | ORAL | Status: DC | PRN

## 2024-02-27 MED ORDER — POTASSIUM CHLORIDE CRYS ER 20 MEQ PO TBCR
40.0000 meq | EXTENDED_RELEASE_TABLET | Freq: Once | ORAL | Status: AC
  Administered 2024-02-27: 40 meq via ORAL
  Filled 2024-02-27: qty 2

## 2024-02-27 MED ORDER — COCONUT OIL OIL: 1.0000 | TOPICAL_OIL | Status: DC | PRN

## 2024-02-27 MED ORDER — ONDANSETRON HCL 4 MG/2ML IJ SOLN: 4.0000 mg | INTRAMUSCULAR | Status: DC | PRN

## 2024-02-27 MED ORDER — NIFEDIPINE 10 MG PO CAPS: 20.0000 mg | ORAL_CAPSULE | ORAL | Status: DC | PRN

## 2024-02-27 MED ORDER — FUROSEMIDE 10 MG/ML IJ SOLN
20.0000 mg | Freq: Once | INTRAMUSCULAR | Status: AC
  Administered 2024-02-27: 20 mg via INTRAVENOUS
  Filled 2024-02-27: qty 2

## 2024-02-27 MED ORDER — NIFEDIPINE 10 MG PO CAPS: 10.0000 mg | ORAL_CAPSULE | ORAL | Status: DC | PRN

## 2024-02-27 MED ORDER — ACETAMINOPHEN-CAFFEINE 500-65 MG PO TABS
1.0000 | ORAL_TABLET | Freq: Three times a day (TID) | ORAL | Status: DC
Start: 2024-02-27 — End: 2024-03-01
  Administered 2024-02-27 – 2024-02-28 (×2): 1 via ORAL
  Filled 2024-02-27 (×7): qty 1

## 2024-02-27 MED ORDER — ZOLPIDEM TARTRATE 5 MG PO TABS: 5.0000 mg | ORAL_TABLET | Freq: Every evening | ORAL | Status: DC | PRN

## 2024-02-27 MED ORDER — IOHEXOL 350 MG/ML SOLN
75.0000 mL | Freq: Once | INTRAVENOUS | Status: AC | PRN
  Administered 2024-02-27: 75 mL via INTRAVENOUS

## 2024-02-27 MED ORDER — DIPHENHYDRAMINE HCL 25 MG PO CAPS: 25.0000 mg | ORAL_CAPSULE | Freq: Four times a day (QID) | ORAL | Status: DC | PRN

## 2024-02-27 MED ORDER — LABETALOL HCL 100 MG PO TABS
100.0000 mg | ORAL_TABLET | Freq: Two times a day (BID) | ORAL | Status: DC
  Administered 2024-02-27 – 2024-02-29 (×5): 100 mg via ORAL
  Filled 2024-02-27 (×5): qty 1

## 2024-02-27 MED ORDER — LABETALOL HCL 5 MG/ML IV SOLN: 40.0000 mg | INTRAVENOUS | Status: DC | PRN

## 2024-02-27 MED ORDER — FUROSEMIDE 10 MG/ML IJ SOLN
40.0000 mg | Freq: Every day | INTRAMUSCULAR | Status: DC
  Administered 2024-02-27 – 2024-02-29 (×3): 40 mg via INTRAVENOUS
  Filled 2024-02-27 (×3): qty 4

## 2024-02-27 MED ORDER — SIMETHICONE 80 MG PO CHEW: 80.0000 mg | CHEWABLE_TABLET | ORAL | Status: DC | PRN

## 2024-02-27 NOTE — Discharge Instructions (Addendum)
To ER via EMS 

## 2024-02-27 NOTE — ED Notes (Signed)
 EMS arrived. Pt transported to ED.

## 2024-02-27 NOTE — ED Triage Notes (Signed)
 Pt states SOB for the past 2 days.  States it got worse last night.  States she is 6 days postpartum. Pt tacyhpneic.   Sat 88% RA. Pt having difficulty speaking in full sentences.

## 2024-02-27 NOTE — ED Notes (Signed)
 Patient is being discharged from the Urgent Care and sent to the Emergency Department via EMS . Per Christoper Fabian NP, patient is in need of higher level of care due to SOB . Patient is aware and verbalizes understanding of plan of care.  Vitals:   02/27/24 0902 02/27/24 0904  BP: (!) 153/105   Pulse: 92   Resp: (!) 40   Temp: 98.1 F (36.7 C)   SpO2: (!) 88% (!) 88%

## 2024-02-27 NOTE — Progress Notes (Signed)
 Presented to patient for evening evaluation.  Upon entry, patient resting comfortably at 45 degree incline with nasal cannula in place.  Infant son also sleeping in bassinet next to her.  Patient's husband states that she has been feeling more energetic since admission.  Patient at this time wakes up.  Endorses improving shortness of breath, has voided significant amount.  Denies chest pain.  Happy to hear that oxygen is now at 2 L.  Continues to have minimal lochia.  Prior headache responded to Excedrin and is improving with sleep.  BP 136/85 (BP Location: Right Arm)   Pulse 84   Temp 98.1 F (36.7 C) (Oral)   Resp (!) 23   SpO2 96%   Breastfeeding Yes  on 2L O2  CT Angio: IMPRESSION: 1. Negative examination for pulmonary embolism. 2. Extensive heterogeneous and consolidative airspace opacity throughout the right lung with additional scattered heterogeneous airspace opacity in the left lung base. Small right, trace left pleural effusions. Findings are consistent with multifocal infection or aspiration.     Latest Ref Rng & Units 02/27/2024   10:40 AM 02/22/2024    4:24 AM 02/21/2024    8:59 PM  CBC  WBC 4.0 - 10.5 K/uL 6.7  10.4  6.4   Hemoglobin 12.0 - 15.0 g/dL 7.8  9.4  19.1   Hematocrit 36.0 - 46.0 % 23.8  29.2  32.6   Platelets 150 - 400 K/uL 329  246  270   Troponin 24 > 18 > 12   Intake/Output Summary (Last 24 hours) at 02/27/2024 2310 Last data filed at 02/27/2024 1900 Gross per 24 hour  Intake 480 ml  Output 4550 ml  Net -4070 ml   Patient with appropriate response to diuresis.  He is requiring less oxygen via nasal cannula, tachypnea is also improving.  Continues to remain afebrile and is normotensive on currently available 100 mg twice daily regimen.  Add CBC to morning labs to include the mat and magnesium level as currently ordered.

## 2024-02-27 NOTE — H&P (Addendum)
 ADDENDUM: Patient seen and examined by myself, agree with CNM findings as below. Briefly, 22yo Peggy [name] now PPD#6 s/p uncomplicated SVD at term. Presenting with new-onset SOB, CP, HA, dyspnea/orthopnea x4d concerning for PreE w. SF vs peripartum cardiomyopathy/CHF. CXR with significant vascular congestion plus cardiomegaly concerning for asymmetric edema vs diffuse infection. Patient afebrile but has elevated BP and new O2 requirement of 4L, unable to recline, diffuse rhonchi and LE edema on exam. Given acute presentation, plan to consult critical care team. In interim, continue 4L O2, CT Angio ordered to rule out PE, BNP still pending. CBC has resulted with 7.8/23.8 but platelets at 329K   1224: BNP resulted at 739.8, CMP with Cr 0.41 buyt Ast/ALT WNL. Will hold magnesium sulfate at this time. Stat Echo ordered, Crit Care re-paged.   1233: Iv Lasix 20mg  ordered given labs and clinical picture  1300: Spoke with Dr Everardo All of [facility], appreciate help in organizing patient care. Reviewed need for stat Echo - if significant dysfunction, would plan to admit to 2-Heart. Otherwise, patient could be followed on main floor with heart Failure team in place. Will f/u on Echo results, appreciate all help thus far  1425: Patient now s/p 2D echocardiogram. Final read as follows: Left ventricular ejection fraction, by estimation, is 50 to 55%. The  left ventricle has low normal function. Per HF team, scheduled Lasix and BP meds would be appropriate at this time. Patient is going to CT-angio at this time, will review plan of care when she returns.   1511: Plan for admission to antepartum for new-onset pulmonary edema/orthopnea. EKG on admission with NSR -IV Lasix 40mg  now, repeat daily. Should have appropriate diuresis within six hours of dosing otherwise may up-titrate     *Daily BMET, mag levels    *Strict Is/Os    *low sodium diet -Labetalol 100mg  BID given edema as well as elevated BP's -Continuous  telemetry while admitted -F/u CT-angio (study just completed) -Routine pp care in interim    *Will have lactation see patient given recent postpartum status as well as breastfeeding with lasix on board -Elevated troponin: value at 1100 24, however likely 2/2 demand ischemia given current events, plan to trend  Chief Complaint:  Shortness of Breath, Leg Swelling, Headache, and Chest Pain     HPI   Event Date/Time   First Provider Initiated Contact with Patient 02/27/24 1017       Peggy [name] is a 23 y.o. [phone] presenting via EMS, 6 days postpartum with new onset SOB, HA, chest pain and edema for the past 3 days. She had a vaginal delivery 02/21/24 without complication.  Normotensive on discharge from hospital with labs WNL. She reports a normal pregnancy and labor course with no complications.    She describes her headache as "intense pressure" around the back of her head, radiating down to her neck. She reports that these symptoms have been present for 3 days, with a marked increase overnight. She reports she has a history of asthma, and initially thought the SOB was related to her asthma, though reports that she has not had an exacerbation or had to use her inhaler since she was a teenager. She reports that she had visual changes 3 days ago, "blurry vision" but this is no longer present. Endorses chills but no fever noted.    She reports she is breastfeeding and last feed was greater than 2 hours ago.  She reports her lochia is WNL. She denies sick contacts, no  other respiratory symptoms, no NVD.   Pregnancy Course: benign       Past Medical History:  Diagnosis Date   Asthma                           OB History  Gravida Para Term Preterm AB Living   3 1 1   1 1    SAB IAB Ectopic Multiple Live Births      1     0 1         # Outcome Date GA Lbr Len/2nd Weight Sex Type Anes PTL Lv  3 Term 02/22/24 [redacted]w[redacted]d 03:59 / 00:01 2690 g M Vag-Spont None   LIV  2 SAB  02/07/20 [redacted]w[redacted]d                1 Gravida                           Past Surgical History:  Procedure Laterality Date   NO PAST SURGERIES                 Family History  Problem Relation Age of Onset   Hypertension Mother     Hypertension Maternal Grandmother     Allergic rhinitis Neg Hx     Angioedema Neg Hx     Asthma Neg Hx     Eczema Neg Hx     Immunodeficiency Neg Hx     Urticaria Neg Hx          Social History  Social History         Tobacco Use   Smoking status: Never   Smokeless tobacco: Never  Vaping Use   Vaping status: Never Used  Substance Use Topics   Alcohol use: Not Currently      Alcohol/week: 0.0 standard drinks of alcohol   Drug use: No      Allergies  No Known Allergies          Medications Prior to Admission  Medication Sig Dispense Refill Last Dose/Taking   albuterol (VENTOLIN HFA) 108 (90 Base) MCG/ACT inhaler Inhale 2 puffs into the lungs every 4 (four) hours as needed for wheezing or shortness of breath (coughing fits). 18 g 1 Past Week   Prenatal Vit-Fe Fumarate-FA (PREPLUS) 27-1 MG TABS Take 1 tablet by mouth daily.     02/26/2024   acetaminophen (TYLENOL) 325 MG tablet Take 650 mg by mouth every 6 (six) hours as needed for mild pain.     Unknown   calcium carbonate (TUMS - DOSED IN MG ELEMENTAL CALCIUM) 500 MG chewable tablet Chew 500 mg by mouth daily as needed for indigestion or heartburn.     Unknown   ibuprofen (ADVIL) 600 MG tablet Take 1 tablet (600 mg total) by mouth every 6 (six) hours as needed for cramping or moderate pain (pain score 4-6). 40 tablet 1 Unknown          I have reviewed patient's Past Medical Hx, Surgical Hx, Family Hx, Social Hx, medications and allergies.    ROS  Pertinent items noted in HPI and remainder of comprehensive ROS otherwise negative.    PHYSICAL EXAM  Patient Vitals for the past 24 hrs:   BP Temp Temp src Pulse Resp SpO2  02/27/24 1120 -- -- -- -- -- 98 %  02/27/24 1116 (!) 141/87 -- -- 87 --  --  02/27/24 1115 -- -- -- -- --  98 %  02/27/24 1105 -- -- -- -- -- 98 %  02/27/24 1101 (!) 144/96 -- -- 84 -- --  02/27/24 1100 -- -- -- -- -- 98 %  02/27/24 1050 -- -- -- -- -- 96 %  02/27/24 1046 139/89 -- -- 82 -- --  02/27/24 1045 -- -- -- -- -- 96 %  02/27/24 1031 (!) 145/93 -- -- 94 -- --  02/27/24 1020 (!) 146/100 98.1 F (36.7 C) Oral 98 (!) 48 97 %      Physical Exam Vitals and nursing note reviewed.  Constitutional:      Appearance: She is well-developed.  HENT:     Head: Normocephalic.  Cardiovascular:     Rate and Rhythm: Normal rate and regular rhythm.     Heart sounds: Normal heart sounds.  Pulmonary:     Effort: Tachypnea and accessory muscle usage present.     Breath sounds: Examination of the right-upper field reveals rhonchi. Examination of the left-upper field reveals rhonchi. Examination of the right-middle field reveals rhonchi. Examination of the left-middle field reveals rhonchi. Rhonchi present.  Abdominal:     Palpations: There is no hepatomegaly.  Musculoskeletal:     Cervical back: Normal range of motion.     Right lower leg: Edema present.     Left lower leg: Edema present.  Skin:    General: Skin is warm and dry.     Capillary Refill: Capillary refill takes less than 2 seconds.  Neurological:     General: No focal deficit present.     Mental Status: She is alert and oriented to person, place, and time.     Deep Tendon Reflexes: Reflexes abnormal.     Comments: Patient endorses headache.  +2 DTRs. Negative for clonus.   Psychiatric:        Mood and Affect: Mood is anxious.        Behavior: Behavior normal.          Labs: Lab Results Last 24 Hours       Results for orders placed or performed during the hospital encounter of 02/27/24 (from the past 24 hours)  CBC with Differential/Platelet     Status: Abnormal    Collection Time: 02/27/24 10:40 AM  Result Value Ref Range    WBC 6.7 4.0 - 10.5 K/uL    RBC 2.95 (L) 3.87 - 5.11 MIL/uL     Hemoglobin 7.8 (L) 12.0 - 15.0 g/dL    HCT 16.1 (L) 09.6 - 46.0 %    MCV 80.7 80.0 - 100.0 fL    MCH 26.4 26.0 - 34.0 pg    MCHC 32.8 30.0 - 36.0 g/dL    RDW 04.5 (H) 40.9 - 15.5 %    Platelets 329 150 - 400 K/uL    nRBC 0.3 (H) 0.0 - 0.2 %    Neutrophils Relative % 76 %    Neutro Abs 5.1 1.7 - 7.7 K/uL    Lymphocytes Relative 17 %    Lymphs Abs 1.1 0.7 - 4.0 K/uL    Monocytes Relative 5 %    Monocytes Absolute 0.3 0.1 - 1.0 K/uL    Eosinophils Relative 1 %    Eosinophils Absolute 0.1 0.0 - 0.5 K/uL    Basophils Relative 0 %    Basophils Absolute 0.0 0.0 - 0.1 K/uL    Immature Granulocytes 1 %    Abs Immature Granulocytes 0.04 0.00 - 0.07 K/uL        Imaging:  DG Chest Portable 1 View Result Date: 02/27/2024 CLINICAL DATA:  Shortness of breath. EXAM: PORTABLE CHEST 1 VIEW COMPARISON:  None Available. FINDINGS: The cardio pericardial silhouette is enlarged. Diffuse vascular congestion with asymmetric airspace disease, right greater than left. But no substantial pleural effusion. IMPRESSION: Cardiomegaly with vascular congestion. Asymmetric airspace disease could be compatible with asymmetric edema or diffuse infection. Electronically Signed   By: Kennith Center M.D.   On: 02/27/2024 10:45      MDM & MAU COURSE  MDM: Ddx Includes postpartum preeclampsia, PE, postpartum hypertension, PP severe preeclampsia.  Will obtain PreE labs, CXR Physical exam as above   MAU Course:    Orders Placed This Encounter  Procedures   DG Chest Portable 1 View   CBC with Differential/Platelet   Comprehensive metabolic panel   Brain natriuretic peptide   Notify physician (specify) Confirmatory reading of BP> 160/110 15 minutes later   Apply Hypertensive Disorders of Pregnancy Care Plan   Measure blood pressure        Meds ordered this encounter  Medications   AND Linked Order Group     NIFEdipine (PROCARDIA) capsule 10 mg     NIFEdipine (PROCARDIA) capsule 20 mg     NIFEdipine (PROCARDIA)  capsule 20 mg     labetalol (NORMODYNE) injection 40 mg      ASSESSMENT    1. Preeclampsia in postpartum period   2. Postpartum hypertension   3. Orthopnea   4. Postpartum edema       PLAN  Call to Dr. Reina Fuse to inform of physical assessment and CXR. Care assumed by Dr. Reina Fuse 02/27/24 @ 1120.    Lamont Snowball, MSN, CNM 02/27/2024 11:24 AM  Certified Nurse Midwife, [facility]

## 2024-02-27 NOTE — MAU Note (Signed)
.  Peggy [name] is a 23 y.o. at [redacted]w[redacted]d here in MAU reporting: Chest pressure, Edema, Headache, SOB x 3 days progressing.   Onset of complaint: 02/24/24 Pain score: 8/10 head pressure There were no vitals filed for this visit.  146/100 BP, 98P, 97% on 4LPM. RR 48

## 2024-02-27 NOTE — MAU Provider Note (Signed)
 Chief Complaint:  Shortness of Breath, Leg Swelling, Headache, and Chest Pain   HPI   Event Date/Time   First Provider Initiated Contact with Patient 02/27/24 1017      Peggy [name] is a 23 y.o. [phone] presenting via EMS, 6 days postpartum with new onset SOB, HA, chest pain and edema for the past 3 days. She had a vaginal delivery 02/21/24 without complication.  Normotensive on discharge from hospital with labs WNL. She reports a normal pregnancy and labor course with no complications.   She describes her headache as "intense pressure" around the back of her head, radiating down to her neck. She reports that these symptoms have been present for 3 days, with a marked increase overnight. She reports she has a history of asthma, and initially thought the SOB was related to her asthma, though reports that she has not had an exacerbation or had to use her inhaler since she was a teenager. She reports that she had visual changes 3 days ago, "blurry vision" but this is no longer present. Endorses chills but no fever noted.   She reports she is breastfeeding and last feed was greater than 2 hours ago.  She reports her lochia is WNL. She denies sick contacts, no other respiratory symptoms, no NVD.  Pregnancy Course: benign  Past Medical History:  Diagnosis Date   Asthma    OB History  Gravida Para Term Preterm AB Living  3 1 1  1 1   SAB IAB Ectopic Multiple Live Births  1   0 1    # Outcome Date GA Lbr Len/2nd Weight Sex Type Anes PTL Lv  3 Term 02/22/24 [redacted]w[redacted]d 03:59 / 00:01 2690 g M Vag-Spont None  LIV  2 SAB 02/07/20 [redacted]w[redacted]d         1 Gravida            Past Surgical History:  Procedure Laterality Date   NO PAST SURGERIES     Family History  Problem Relation Age of Onset   Hypertension Mother    Hypertension Maternal Grandmother    Allergic rhinitis Neg Hx    Angioedema Neg Hx    Asthma Neg Hx    Eczema Neg Hx    Immunodeficiency Neg Hx    Urticaria Neg Hx     Social History   Tobacco Use   Smoking status: Never   Smokeless tobacco: Never  Vaping Use   Vaping status: Never Used  Substance Use Topics   Alcohol use: Not Currently    Alcohol/week: 0.0 standard drinks of alcohol   Drug use: No   No Known Allergies Medications Prior to Admission  Medication Sig Dispense Refill Last Dose/Taking   albuterol (VENTOLIN HFA) 108 (90 Base) MCG/ACT inhaler Inhale 2 puffs into the lungs every 4 (four) hours as needed for wheezing or shortness of breath (coughing fits). 18 g 1 Past Week   Prenatal Vit-Fe Fumarate-FA (PREPLUS) 27-1 MG TABS Take 1 tablet by mouth daily.   02/26/2024   acetaminophen (TYLENOL) 325 MG tablet Take 650 mg by mouth every 6 (six) hours as needed for mild pain.   Unknown   calcium carbonate (TUMS - DOSED IN MG ELEMENTAL CALCIUM) 500 MG chewable tablet Chew 500 mg by mouth daily as needed for indigestion or heartburn.   Unknown   ibuprofen (ADVIL) 600 MG tablet Take 1 tablet (600 mg total) by mouth every 6 (six) hours as needed for cramping or moderate pain (pain score 4-6).  40 tablet 1 Unknown    I have reviewed patient's Past Medical Hx, Surgical Hx, Family Hx, Social Hx, medications and allergies.   ROS  Pertinent items noted in HPI and remainder of comprehensive ROS otherwise negative.   PHYSICAL EXAM  Patient Vitals for the past 24 hrs:  BP Temp Temp src Pulse Resp SpO2  02/27/24 1120 -- -- -- -- -- 98 %  02/27/24 1116 (!) 141/87 -- -- 87 -- --  02/27/24 1115 -- -- -- -- -- 98 %  02/27/24 1105 -- -- -- -- -- 98 %  02/27/24 1101 (!) 144/96 -- -- 84 -- --  02/27/24 1100 -- -- -- -- -- 98 %  02/27/24 1050 -- -- -- -- -- 96 %  02/27/24 1046 139/89 -- -- 82 -- --  02/27/24 1045 -- -- -- -- -- 96 %  02/27/24 1031 (!) 145/93 -- -- 94 -- --  02/27/24 1020 (!) 146/100 98.1 F (36.7 C) Oral 98 (!) 48 97 %    Physical Exam Vitals and nursing note reviewed.  Constitutional:      Appearance: She is well-developed.  HENT:      Head: Normocephalic.  Cardiovascular:     Rate and Rhythm: Normal rate and regular rhythm.     Heart sounds: Normal heart sounds.  Pulmonary:     Effort: Tachypnea and accessory muscle usage present.     Breath sounds: Examination of the right-upper field reveals rhonchi. Examination of the left-upper field reveals rhonchi. Examination of the right-middle field reveals rhonchi. Examination of the left-middle field reveals rhonchi. Rhonchi present.  Abdominal:     Palpations: There is no hepatomegaly.  Musculoskeletal:     Cervical back: Normal range of motion.     Right lower leg: Edema present.     Left lower leg: Edema present.  Skin:    General: Skin is warm and dry.     Capillary Refill: Capillary refill takes less than 2 seconds.  Neurological:     General: No focal deficit present.     Mental Status: She is alert and oriented to person, place, and time.     Deep Tendon Reflexes: Reflexes abnormal.     Comments: Patient endorses headache.  +2 DTRs. Negative for clonus.   Psychiatric:        Mood and Affect: Mood is anxious.        Behavior: Behavior normal.        Labs: Results for orders placed or performed during the hospital encounter of 02/27/24 (from the past 24 hours)  CBC with Differential/Platelet     Status: Abnormal   Collection Time: 02/27/24 10:40 AM  Result Value Ref Range   WBC 6.7 4.0 - 10.5 K/uL   RBC 2.95 (L) 3.87 - 5.11 MIL/uL   Hemoglobin 7.8 (L) 12.0 - 15.0 g/dL   HCT 52.8 (L) 41.3 - 24.4 %   MCV 80.7 80.0 - 100.0 fL   MCH 26.4 26.0 - 34.0 pg   MCHC 32.8 30.0 - 36.0 g/dL   RDW 01.0 (H) 27.2 - 53.6 %   Platelets 329 150 - 400 K/uL   nRBC 0.3 (H) 0.0 - 0.2 %   Neutrophils Relative % 76 %   Neutro Abs 5.1 1.7 - 7.7 K/uL   Lymphocytes Relative 17 %   Lymphs Abs 1.1 0.7 - 4.0 K/uL   Monocytes Relative 5 %   Monocytes Absolute 0.3 0.1 - 1.0 K/uL   Eosinophils Relative 1 %  Eosinophils Absolute 0.1 0.0 - 0.5 K/uL   Basophils Relative 0 %    Basophils Absolute 0.0 0.0 - 0.1 K/uL   Immature Granulocytes 1 %   Abs Immature Granulocytes 0.04 0.00 - 0.07 K/uL    Imaging:  DG Chest Portable 1 View Result Date: 02/27/2024 CLINICAL DATA:  Shortness of breath. EXAM: PORTABLE CHEST 1 VIEW COMPARISON:  None Available. FINDINGS: The cardio pericardial silhouette is enlarged. Diffuse vascular congestion with asymmetric airspace disease, right greater than left. But no substantial pleural effusion. IMPRESSION: Cardiomegaly with vascular congestion. Asymmetric airspace disease could be compatible with asymmetric edema or diffuse infection. Electronically Signed   By: Kennith Center M.D.   On: 02/27/2024 10:45    MDM & MAU COURSE  MDM: Ddx Includes postpartum preeclampsia, PE, postpartum hypertension, PP severe preeclampsia.  Will obtain PreE labs, CXR Physical exam as above  MAU Course: Orders Placed This Encounter  Procedures   DG Chest Portable 1 View   CBC with Differential/Platelet   Comprehensive metabolic panel   Brain natriuretic peptide   Notify physician (specify) Confirmatory reading of BP> 160/110 15 minutes later   Apply Hypertensive Disorders of Pregnancy Care Plan   Measure blood pressure   Meds ordered this encounter  Medications   AND Linked Order Group    NIFEdipine (PROCARDIA) capsule 10 mg    NIFEdipine (PROCARDIA) capsule 20 mg    NIFEdipine (PROCARDIA) capsule 20 mg    labetalol (NORMODYNE) injection 40 mg    ASSESSMENT   1. Preeclampsia in postpartum period   2. Postpartum hypertension   3. Orthopnea   4. Postpartum edema     PLAN  Call to Dr. Reina Fuse to inform of physical assessment and CXR. Care assumed by Dr. Reina Fuse 02/27/24 @ 1120.   Lamont Snowball, MSN, CNM 02/27/2024 11:24 AM  Certified Nurse Midwife, [facility]

## 2024-02-27 NOTE — Progress Notes (Signed)
  Echocardiogram 2D Echocardiogram has been performed.  Peggy [name] 02/27/2024, 1:53 PM

## 2024-02-27 NOTE — ED Notes (Signed)
 Pt placed on O2 2l [place].  Jodi NP in to assess pt.
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
## 2024-02-27 NOTE — ED Notes (Signed)
 EMS called to transport patient to the ED.  EKG done.  Pt states the O2 has helped her a little.  Continuous monitoring of oxygen saturation.  Pt maintaining 93-94%

## 2024-02-27 NOTE — Consult Note (Addendum)
   ADVANCED HEART FAILURE CONSULT NOTE  Referring Physician: No ref. provider found  Primary Care: Peggy Au, MD Primary Cardiologist:  CC: Pulmonary edema  HPI: Peggy [name] is a 23 y.o. female [phone] currently PPD#6 with hx of asthma (well controlled) that presented with shortness of breath, headaches and mild LE edema. Ms. Peggy [name] had a healthy vaginal delivery without complication on 02/21/24. AT discharge her blood pressures were normal and according to patient she has never been hypertensive. Roughly 2-3 days ago she started to have severe headaches and shortness of breath that was worse with exertion. During this time she also reports having some mild vision changes. In the ER, patient was noted to be hypertensive to 153/105 with increased work of breathing and O2 sat of 88%. 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   On my evaluation, patient on [place] O2 but comfortable with normal work of breathing; lung sounds with basilar inspiratory crackles; JVP mildly elevated. No overt signs of low output heart failure.   Pertinent Family & social hx: No significant hx of heart failure   PHYSICAL EXAM: Vitals:   02/27/24 1730 02/27/24 1800  BP:    Pulse:    Resp:    Temp:    SpO2: 100% 97%   GENERAL: NAD Lungs- mild inspiratory crackles CARDIAC:  JVP: 8-9 cm          Normal rate with regular rhythm. no murmur.  Pulses 2+. mild edema.  ABDOMEN: Soft, non-tender, non-distended.  EXTREMITIES: Warm and well perfused.  NEUROLOGIC: No obvious FND   DATA REVIEW  ECG: 02/27/23: NSR  As per my personal interpretation  ECHO: LVEF 55%, normal RV function As per my personal interpretation  ASSESSMENT & PLAN:  Pulmonary Edema / Dyspnea - Personally reviewed TTE; LVEF 55%. At worst it appears to be low normal in some windows; possibly EF of 50%. No significant MR/TR/PI.  - Urinalysis ordered to  assess for proteinuria - hs trop stable, most recently 18. Can stop trending after x3.  - IV lasix 40mg  x1 today; will repeat tomorrow AM.  - 3.6L urine output so far today; KCL ordered.  - I suspect her pulmonary edema is from sudden onset hypertension; ?post-partum pre-eclampsia - Will monitor on telemetry unit until she is weaned off O2.  - Will discuss addition of anti-HTN with OBGYN; patient is actively breast feeding. Can start CCB (amlodipine v nifedipine PO)   Peggy [name] Advanced Heart Failure Mechanical Circulatory Support

## 2024-02-28 ENCOUNTER — Inpatient Hospital Stay (HOSPITAL_COMMUNITY)

## 2024-02-28 LAB — CBC WITH DIFFERENTIAL/PLATELET
Abs Immature Granulocytes: 0.05 10*3/uL (ref 0.00–0.07)
Basophils Absolute: 0 10*3/uL (ref 0.0–0.1)
Basophils Relative: 0 %
Eosinophils Absolute: 0.1 10*3/uL (ref 0.0–0.5)
Eosinophils Relative: 2 %
HCT: 24.6 % — ABNORMAL LOW (ref 36.0–46.0)
Hemoglobin: 7.9 g/dL — ABNORMAL LOW (ref 12.0–15.0)
Immature Granulocytes: 1 %
Lymphocytes Relative: 30 %
Lymphs Abs: 1.9 10*3/uL (ref 0.7–4.0)
MCH: 25.6 pg — ABNORMAL LOW (ref 26.0–34.0)
MCHC: 32.1 g/dL (ref 30.0–36.0)
MCV: 79.6 fL — ABNORMAL LOW (ref 80.0–100.0)
Monocytes Absolute: 0.5 10*3/uL (ref 0.1–1.0)
Monocytes Relative: 8 %
Neutro Abs: 3.7 10*3/uL (ref 1.7–7.7)
Neutrophils Relative %: 59 %
Platelets: 349 10*3/uL (ref 150–400)
RBC: 3.09 MIL/uL — ABNORMAL LOW (ref 3.87–5.11)
RDW: 15.8 % — ABNORMAL HIGH (ref 11.5–15.5)
WBC: 6.1 10*3/uL (ref 4.0–10.5)
nRBC: 0.7 % — ABNORMAL HIGH (ref 0.0–0.2)

## 2024-02-28 LAB — BASIC METABOLIC PANEL
Anion gap: 9 (ref 5–15)
BUN: 14 mg/dL (ref 6–20)
CO2: 22 mmol/L (ref 22–32)
Calcium: 8.1 mg/dL — ABNORMAL LOW (ref 8.9–10.3)
Chloride: 107 mmol/L (ref 98–111)
Creatinine, Ser: 0.47 mg/dL (ref 0.44–1.00)
GFR, Estimated: >60 mL/min (ref >60)
Glucose, Bld: 92 mg/dL (ref 70–99)
Potassium: 3.9 mmol/L (ref 3.5–5.1)
Sodium: 138 mmol/L (ref 135–145)

## 2024-02-28 LAB — MAGNESIUM: Magnesium: 2.2 mg/dL (ref 1.7–2.4)

## 2024-02-28 MED ORDER — FUROSEMIDE 10 MG/ML IJ SOLN
20.0000 mg | Freq: Once | INTRAMUSCULAR | Status: AC
  Administered 2024-02-28: 20 mg via INTRAVENOUS
  Filled 2024-02-28: qty 2

## 2024-02-28 NOTE — Consult Note (Signed)
   ADVANCED HEART FAILURE CONSULT NOTE  Referring Physician: No ref. provider found  Primary Care: Verlon Au, MD Primary Cardiologist:  CC: Pulmonary edema  Interval hx:  - Significant improvement in O2 requirement; down to 1LPM   PHYSICAL EXAM: Vitals:   02/28/24 1100 02/28/24 1111  BP:  124/72  Pulse:  (!) 103  Resp:  18  Temp:  98.4 F (36.9 C)  SpO2: 98% 98%   GENERAL: NAD Lungs- CTA CARDIAC:  JVP: 8         Normal rate with regular rhythm. no murmur.  Pulses 2+. Mild edema ABDOMEN: Soft, non-tender, non-distended.  EXTREMITIES: Warm and well perfused.  NEUROLOGIC: No obvious FND   DATA REVIEW  ECG: 02/27/23: NSR  As per my personal interpretation  ECHO: LVEF 55%, normal RV function As per my personal interpretation  ASSESSMENT & PLAN:  Pulmonary Edema / Dyspnea - Personally reviewed TTE; LVEF 55%. At worst it appears to be low normal in some windows; possibly EF of 50%. No significant MR/TR/PI.  - Urinalysis ordered to assess for proteinuria - hs trop stable, most recently 18. Can stop trending after x3.  - Significant urine output yesterday; on 1LPM O2 today.  - Repeat CXR with improvement in pulmonary edema, however, not resolved. JVP mildly elevated on exam.  - Repeat IV lasix 40mg  this AM with 20mg  this evening.  - Can likely discharge tomorrow with PRN lasix 40mg  for shortness of breath or weight gain.  - HF will follow in clinic with repeat outpatient TTE.    Dorthea Maina Advanced Heart Failure Mechanical Circulatory Support

## 2024-02-28 NOTE — Progress Notes (Signed)
 AP Progress Note  S: Patient able to rest comfortably overnight. Minimal lochia. Denies SOB, CP, feeling tight in her back. Denies fevers, chills. Denies productive cough, headache is improved from yesterday.   O: BP 130/77 (BP Location: Right Arm)   Pulse 77   Temp 98.8 F (37.1 C) (Oral)   Resp (!) 25   Wt 75.8 kg   SpO2 98%   Breastfeeding Yes   BMI 31.55 kg/m  with 2L [place]  Gen: [place] in place, appears relaxed, laying at incline in bed with infant feeding CV: RRR, no n/r/g. No edema on LE this AM. Inspiratory crackles in RLL, improved from yesterday Abd: NTTP, BS x4, firm surpapubic fundus Psych/Neuro: WNL  Intake/Output Summary (Last 24 hours) at 02/28/2024 0937 Last data filed at 02/28/2024 0900 Gross per 24 hour  Intake 480 ml  Output 5250 ml  Net -4770 ml   7.9/24.6/349K, WBC 6.1, Cr 0.47, K 3.9 Troponin 24 > 18 > 12 > 11  A/P: Patient now PPD#7/HD#2 admitted with new-onset orthopnea and oxygen demands x4d. Heart Failure team following for new pulmonary edema -2D ECHO with EF 50-55%, CT angio neg for PE, EKG NSR -Troponins appropriately downtrending, will hold on further draws at this time -Patient appropriately diuresing on IV Lasix 40mg  every day    *O2 demands decreased, tachypnea improving -Mild BP elevation: Normotensive on Labetalol 100mg  BID, asymptomatic from PreE standpoint

## 2024-02-29 LAB — CBC WITH DIFFERENTIAL/PLATELET
Abs Immature Granulocytes: 0.03 10*3/uL (ref 0.00–0.07)
Basophils Absolute: 0 10*3/uL (ref 0.0–0.1)
Basophils Relative: 0 %
Eosinophils Absolute: 0.1 10*3/uL (ref 0.0–0.5)
Eosinophils Relative: 2 %
HCT: 29.9 % — ABNORMAL LOW (ref 36.0–46.0)
Hemoglobin: 9.3 g/dL — ABNORMAL LOW (ref 12.0–15.0)
Immature Granulocytes: 1 %
Lymphocytes Relative: 34 %
Lymphs Abs: 2.1 10*3/uL (ref 0.7–4.0)
MCH: 25.1 pg — ABNORMAL LOW (ref 26.0–34.0)
MCHC: 31.1 g/dL (ref 30.0–36.0)
MCV: 80.8 fL (ref 80.0–100.0)
Monocytes Absolute: 0.5 10*3/uL (ref 0.1–1.0)
Monocytes Relative: 9 %
Neutro Abs: 3.4 10*3/uL (ref 1.7–7.7)
Neutrophils Relative %: 54 %
Platelets: 424 10*3/uL — ABNORMAL HIGH (ref 150–400)
RBC: 3.7 MIL/uL — ABNORMAL LOW (ref 3.87–5.11)
RDW: 15.6 % — ABNORMAL HIGH (ref 11.5–15.5)
WBC: 6.2 10*3/uL (ref 4.0–10.5)
nRBC: 0.3 % — ABNORMAL HIGH (ref 0.0–0.2)

## 2024-02-29 LAB — BASIC METABOLIC PANEL
Anion gap: 7 (ref 5–15)
BUN: 16 mg/dL (ref 6–20)
CO2: 22 mmol/L (ref 22–32)
Calcium: 8.4 mg/dL — ABNORMAL LOW (ref 8.9–10.3)
Chloride: 108 mmol/L (ref 98–111)
Creatinine, Ser: 0.53 mg/dL (ref 0.44–1.00)
GFR, Estimated: >60 mL/min (ref >60)
Glucose, Bld: 86 mg/dL (ref 70–99)
Potassium: 3.9 mmol/L (ref 3.5–5.1)
Sodium: 137 mmol/L (ref 135–145)

## 2024-02-29 LAB — MAGNESIUM: Magnesium: 2.4 mg/dL (ref 1.7–2.4)

## 2024-02-29 MED ORDER — FUROSEMIDE 40 MG PO TABS: 40.0000 mg | ORAL_TABLET | ORAL | Status: DC | PRN

## 2024-02-29 MED ORDER — FUROSEMIDE 40 MG PO TABS: 40.0000 mg | ORAL_TABLET | Freq: Every day | ORAL | 0 refills | Status: AC | PRN

## 2024-02-29 MED ORDER — NIFEDIPINE ER OSMOTIC RELEASE 30 MG PO TB24: 30.0000 mg | ORAL_TABLET | Freq: Every day | ORAL | 0 refills | Status: AC

## 2024-02-29 NOTE — Discharge Summary (Signed)
 Physician Discharge Summary  Patient ID: Peggy [name] MRN: [id number] DOB/AGE: Apr 28, 2001 22 y.o.  Admit date: 02/27/2024 Discharge date: 02/29/2024  Admission Diagnoses: pulmonary edema  Discharge Diagnoses: same, resolved Principal Problem:   Pulmonary edema   Discharged Condition: stable  Hospital Course: Peggy [name] is a 23 year old admitted  3/8-3/10/25 (PPD#6-8) for new-onset orthopnea and oxygen demands x4d, found to have pulmonary edema. Pulmonary edema:  -Heart Failure team followed while inpatient, pt has outpatient f/u visit scheduled. -2D ECHO with EF 50-55%, CT angio neg for PE, EKG NSR -Troponins showed demand ischemia, appropriately downtrended to normal. -Patient appropriately diuresed on IV Lasix 40mg  every day. Per HF team, prescribed for PRN weight gain or SOB outpatient. -tachypnea resolved, normal O2 sats on room air for the most recent 20 hours. -started on labetalol 100mg  BID on admission for potential HF and elevated BPs, normotensive overall since diuresis, now mildly hypotensive. Asymptomatic from PreE standpoint. Will d/c labetalol and start nifedipine 30mg  daily at home per HF MD reqs.  Consults:  heart failure MD  Significant Diagnostic Studies: see hospital course  Treatments: see hospital course  Discharge Exam: Blood pressure (!) 99/55, pulse 77, temperature 98.2 F (36.8 C), temperature source Oral, resp. rate 18, weight 73.6 kg, SpO2 95%, currently breastfeeding. Gen: appears relaxed, laying in bed Resp: normal work of breathing on room air, normal respiratory rate CV: well perfused, no LE edema Abd: non-distended Psych/Neuro: WNL  Disposition: Discharge disposition: 01-Home or Self Care       Discharge Instructions     Call MD for:  difficulty breathing, headache or visual disturbances   Complete by: As directed    Call MD for:  extreme fatigue   Complete by: As directed    Call MD for:  hives    Complete by: As directed    Call MD for:  persistant dizziness or light-headedness   Complete by: As directed    Call MD for:  persistant nausea and vomiting   Complete by: As directed    Call MD for:  severe uncontrolled pain   Complete by: As directed    Call MD for:  temperature >100.4   Complete by: As directed    Diet - low sodium heart healthy   Complete by: As directed    Sexual activity   Complete by: As directed       Allergies as of 02/29/2024   No Known Allergies      Medication List     STOP taking these medications    calcium carbonate 500 MG chewable tablet Commonly known as: TUMS - dosed in mg elemental calcium       TAKE these medications    acetaminophen 325 MG tablet Commonly known as: TYLENOL Take 650 mg by mouth every 6 (six) hours as needed for mild pain.   albuterol 108 (90 Base) MCG/ACT inhaler Commonly known as: Ventolin HFA Inhale 2 puffs into the lungs every 4 (four) hours as needed for wheezing or shortness of breath (coughing fits).   furosemide 40 MG tablet Commonly known as: LASIX Take 1 tablet (40 mg total) by mouth daily as needed for edema (3 pound weight gain or shortness of breath).   ibuprofen 600 MG tablet Commonly known as: ADVIL Take 1 tablet (600 mg total) by mouth every 6 (six) hours as needed for cramping or moderate pain (pain score 4-6).   NIFEdipine 30 MG 24 hr tablet Commonly known as: PROCARDIA-XL/NIFEDICAL-XL Take 1  tablet (30 mg total) by mouth daily.   PrePLUS 27-1 MG Tabs Take 1 tablet by mouth daily.        Follow-up Information     Ob/Gyn, Nestor Ramp. Schedule an appointment as soon as possible for a visit in 1 week(s).   Contact information: [address] [place] [address] [phone]                 Signed: Willa Frater 02/29/2024, 1:13 PM

## 2024-02-29 NOTE — Progress Notes (Addendum)
 AP Progress Note  S: Feeling "so much better!". Patient able to rest comfortably overnight. Minimal lochia. Denies SOB, CP, feeling tight in her back. Denies fevers, chills. Denies productive cough and headache.  O:     02/29/2024   11:14 AM 02/29/2024    7:52 AM 02/29/2024    4:27 AM  Vitals with BMI  Weight   162 lbs 3 oz  BMI   30.66  Systolic 99 111 126  Diastolic 55 63 87  Pulse 77 83 88     Gen: appears relaxed, laying in bed Resp: normal work of breathing on room air, normal respiratory rate CV: well perfused, no LE edema Abd: non-distended Psych/Neuro: WNL  Intake/Output Summary (Last 24 hours) at 02/29/2024 1248 Last data filed at 02/29/2024 1042 Gross per 24 hour  Intake 300 ml  Output 3300 ml  Net -3000 ml   7.9/24.6/349K, WBC 6.1, Cr 0.47, K 3.9 Troponin 24 > 18 > 12 > 11  A/P: Peggy [name] is a 23 year old now PPD#8/HD#3 admitted with new-onset orthopnea and oxygen demands x4d, found to have pulmonary edema. Pulmonary edema:  -Heart Failure team following, pt has outpatient f/u visit scheduled. -2D ECHO with EF 50-55%, CT angio neg for PE, EKG NSR -Troponins showed demand ischemia, appropriately downtrended to normal. -Patient appropriately diuresed on IV Lasix 40mg  every day, per HF team, will prescribe for PRN weight gain or SOB outpatient. -tachypnea resolved, normal O2 sats on room air for the most recent 20 hours. -started on labetalol 100mg  BID on admission for potential HF and elevated BPs, normotensive overall since diuresis, now mildly hypotensive. Asymptomatic from PreE standpoint. Will d/c labetalol and start nifedipine 30mg  daily at home per HF MD reqs.  Dispo: Stable for discharge home today with precautions and close follow-up.  Ambrose Mantle, MD

## 2024-02-29 NOTE — Progress Notes (Addendum)
   ADVANCED HEART FAILURE CONSULT NOTE  Referring Physician: No ref. provider found  Primary Care: Verlon Au, MD Primary Cardiologist:  CC: Pulmonary edema  Interval hx:  - Weaned off O2 today; doing very well. Feels that she is at her baseline.    PHYSICAL EXAM: Vitals:   02/29/24 0705 02/29/24 0752  BP:  111/63  Pulse:  83  Resp: 19 18  Temp:  98.3 F (36.8 C)  SpO2:  95%   GENERAL: NAD Lungs- CTA CARDIAC:  JVP: 5         Normal rate with regular rhythm. no murmur.  Pulses 2+. Mild edema ABDOMEN: Soft, non-tender, non-distended.  EXTREMITIES: Warm and well perfused.  NEUROLOGIC: No obvious FND   DATA REVIEW  ECG: 02/27/23: NSR  As per my personal interpretation  ECHO: LVEF 55%, normal RV function As per my personal interpretation  ASSESSMENT & PLAN:  Pulmonary Edema / Dyspnea - Personally reviewed TTE; LVEF 55%. At worst it appears to be low normal in some windows; possibly EF of 50%. No significant MR/TR/PI.  - Urinalysis with no proteinuria - hs trop stable - Euvolemic on exam today; received IV lasix 40mg  this AM. Would hold off any further diuresis.  - Lasix 40mg  PO PRN only at home.  - Will follow up in HF clinic with repeat TTE outpatient.  - Would transition from labetalol to losartan 25mg  daily if okay from OBGYN standpoint or amlodipine 5mg  daily.  - Stable for discharge home from HF standpoint.    Peggy [name] Advanced Heart Failure Mechanical Circulatory Support

## 2024-03-04 ENCOUNTER — Telehealth (HOSPITAL_COMMUNITY): Payer: Self-pay | Admitting: *Deleted

## 2024-03-04 NOTE — Telephone Encounter (Signed)
 03/04/2024  Name: Ezra Marquess Mondragon-Gutierrez MRN: [id number] DOB: Jun 13, 2001  Reason for Call:  Transition of Care Hospital Discharge Call  Contact Status: Patient Contact Status: Message  Language assistant needed:          Follow-Up Questions:    Inocente Salles Postnatal Depression Scale:  In the Past 7 Days:    PHQ2-9 Depression Scale:     Discharge Follow-up:    Post-discharge interventions: NA  Salena Saner, RN 03/04/2024 10:01

## 2024-03-09 ENCOUNTER — Inpatient Hospital Stay (HOSPITAL_COMMUNITY): Admission: RE | Admit: 2024-03-09 | Payer: Medicaid Other | Source: Home / Self Care | Admitting: Obstetrics

## 2024-03-11 ENCOUNTER — Ambulatory Visit (HOSPITAL_COMMUNITY)
Admission: RE | Admit: 2024-03-11 | Discharge: 2024-03-11 | Disposition: A | Discharge status: Home / Self Care | Source: Ambulatory Visit | Attending: Cardiology | Admitting: Cardiology

## 2024-03-11 ENCOUNTER — Encounter (HOSPITAL_COMMUNITY): Payer: Self-pay | Admitting: Cardiology

## 2024-03-11 VITALS — BP 122/74 | HR 97 | Ht 61.0 in | Wt 159.2 lb

## 2024-03-11 DIAGNOSIS — Z79899 Other long term (current) drug therapy: Secondary | ICD-10-CM | POA: Insufficient documentation

## 2024-03-11 DIAGNOSIS — J45909 Unspecified asthma, uncomplicated: Secondary | ICD-10-CM | POA: Diagnosis not present

## 2024-03-11 DIAGNOSIS — E8770 Fluid overload, unspecified: Secondary | ICD-10-CM | POA: Diagnosis not present

## 2024-03-11 DIAGNOSIS — O115 Pre-existing hypertension with pre-eclampsia, complicating the puerperium: Secondary | ICD-10-CM | POA: Insufficient documentation

## 2024-03-11 DIAGNOSIS — Z09 Encounter for follow-up examination after completed treatment for conditions other than malignant neoplasm: Secondary | ICD-10-CM | POA: Diagnosis present

## 2024-03-11 DIAGNOSIS — I160 Hypertensive urgency: Secondary | ICD-10-CM | POA: Insufficient documentation

## 2024-03-11 DIAGNOSIS — I10 Essential (primary) hypertension: Secondary | ICD-10-CM

## 2024-03-11 DIAGNOSIS — O9953 Diseases of the respiratory system complicating the puerperium: Secondary | ICD-10-CM | POA: Insufficient documentation

## 2024-03-11 DIAGNOSIS — R0602 Shortness of breath: Secondary | ICD-10-CM | POA: Diagnosis not present

## 2024-03-11 DIAGNOSIS — O1003 Pre-existing essential hypertension complicating the puerperium: Secondary | ICD-10-CM | POA: Insufficient documentation

## 2024-03-11 LAB — BASIC METABOLIC PANEL
Anion gap: 9 (ref 5–15)
BUN: 9 mg/dL (ref 6–20)
CO2: 24 mmol/L (ref 22–32)
Calcium: 9.5 mg/dL (ref 8.9–10.3)
Chloride: 106 mmol/L (ref 98–111)
Creatinine, Ser: 0.45 mg/dL (ref 0.44–1.00)
GFR, Estimated: >60 mL/min (ref >60)
Glucose, Bld: 80 mg/dL (ref 70–99)
Potassium: 4 mmol/L (ref 3.5–5.1)
Sodium: 139 mmol/L (ref 135–145)

## 2024-03-11 LAB — BRAIN NATRIURETIC PEPTIDE: B Natriuretic Peptide: 9.7 pg/mL (ref 0.0–100.0)

## 2024-03-11 NOTE — Progress Notes (Addendum)
   ADVANCED HEART FAILURE CLINIC NOTE  Referring Physician: Verlon Au, MD  Primary Care: Verlon Au, MD Primary Cardiologist: Dr. Gasper Lloyd  CC: Evaluation for HF/Post-hospitalization follow up  HPI: Peggy [name] is a 23 y.o. female [phone] with hx of well controlled asthma that was admitted in 02/27/24 (few days after delivery of her 2nd child) for hypertensive urgency and flash pulmonary edema. She had a TTE during admission with LVEF of 55% (at worst 50%). I diuresed her for two days and started her on nifedipine. Today she presents for follow up.   From a functional standpoint she has been doing remarkably well. She reports no dyspnea, PND, orthopnea, lightheadedness, or LE edema. She has not required lasix. BP has been stable in the 120s systolic on nifedipine.    PHYSICAL EXAM: Vitals:   03/11/24 1029  BP: 122/74  Pulse: 97  SpO2: 98%   GENERAL: NAD Lungs- CTA CARDIAC:  JVP: 6 cm          Normal rate with regular rhythm. No murmur.  Pulses 2+. No edema.  ABDOMEN: Soft, non-tender, non-distended.  EXTREMITIES: Warm and well perfused.  NEUROLOGIC: No obvious FND   DATA REVIEW  ECG: 03/11/24:   As per my personal interpretation  ECHO: 02/27/24: LVEF 55%, normal RV function, trivial MR as per my personal interpretation  ASSESSMENT & PLAN:  Volume overload/HTN urgency/Pre-eclampsia - Doing very well now on nifedipine 30mg  daily - Euvolemic on exam. No longer requiring lasix.  - SBP well controlled.  - Repeat echo in 55m to ensure LVEF is completely normal now.   2. HTN - continue nifedipine  - repeat BMP/BNP today.    Peggy [name] Advanced Heart Failure Mechanical Circulatory Support

## 2024-03-11 NOTE — Patient Instructions (Signed)
 Medication Changes:  No Changes In Medications at this time.   Lab Work:  Labs done today, your results will be available in MyChart, we will contact you for abnormal readings.  ECHOCARDIOGRAM AS SCHEDULED   Follow-Up in: AS NEEDED   At the [facility], you and your health needs are our priority. We have a designated team specialized in the treatment of Heart Failure. This Care Team includes your primary Heart Failure Specialized Cardiologist (physician), Advanced Practice Providers (APPs- Physician Assistants and Nurse Practitioners), and Pharmacist who all work together to provide you with the care you need, when you need it.   You may see any of the following providers on your designated Care Team at your next follow up:  Dr. Arvilla Meres Dr. Marca Ancona Dr. Dorthula Nettles Dr. Theresia Bough Tonye Becket, NP Robbie Lis, [place] [facility], [place] Brynda Peon, NP [place] Lee, NP Karle Plumber, PharmD   Please be sure to bring in all your medications bottles to every appointment.   Need to Contact us:  If you have any questions or concerns before your next appointment please send [place] a message through [place] or call our office at [phone].    TO LEAVE A MESSAGE FOR THE NURSE SELECT OPTION 2, PLEASE LEAVE A MESSAGE INCLUDING: YOUR NAME DATE OF BIRTH CALL BACK NUMBER REASON FOR CALL**this is important as we prioritize the call backs  YOU WILL RECEIVE A CALL BACK THE SAME DAY AS LONG AS YOU CALL BEFORE 4:00 PM

## 2024-04-05 ENCOUNTER — Ambulatory Visit (HOSPITAL_COMMUNITY)
Admission: RE | Admit: 2024-04-05 | Discharge: 2024-04-05 | Disposition: A | Discharge status: Home / Self Care | Source: Ambulatory Visit | Attending: Family Medicine | Admitting: Family Medicine

## 2024-04-05 DIAGNOSIS — R0602 Shortness of breath: Secondary | ICD-10-CM | POA: Insufficient documentation

## 2024-04-05 LAB — ECHOCARDIOGRAM COMPLETE
AR max vel: 2.04 cm2
AV Peak grad: 9.5 mmHg
Ao pk vel: 1.54 m/s
Area-P 1/2: 3.63 cm2
Calc EF: 57.8 %
MV VTI: 2.63 cm2
S' Lateral: 3.6 cm
Single Plane A2C EF: 56.3 %
Single Plane A4C EF: 59.1 %

## 2024-04-26 ENCOUNTER — Encounter: Payer: Self-pay | Admitting: Nurse Practitioner

## 2024-04-26 ENCOUNTER — Ambulatory Visit (INDEPENDENT_AMBULATORY_CARE_PROVIDER_SITE_OTHER): Admitting: Nurse Practitioner

## 2024-04-26 VITALS — BP 111/68 | HR 84 | Temp 98.1°F | Wt 165.0 lb

## 2024-04-26 DIAGNOSIS — D649 Anemia, unspecified: Secondary | ICD-10-CM

## 2024-04-26 NOTE — Patient Instructions (Signed)
 1. Anemia, unspecified type (Primary)  - CBC - Comprehensive metabolic panel with GFR

## 2024-04-26 NOTE — Progress Notes (Signed)
 Subjective   Patient ID: Peggy [name], female    DOB: 2001-09-04, 23 y.o.   MRN: [id number]  Chief Complaint  Patient presents with   Medical Management of Chronic Issues    Referring provider: Vergil Glasser, MD  Peggy [name] D [name] is a 23 y.o. female with Past Medical History: No date: Asthma   HPI  Patient presents today to establish care.  She recently had a baby in March.  She did have some complications during labor and delivery.  She has followed with cardiology and has been cleared at this point.  She is still on blood pressure medicine.  She did forget to take her blood pressure medicine today.  Overall she is doing well with no new issues or concerns today.  We did need to recheck labs for anemia. Denies f/c/s, n/v/d, hemoptysis, PND, leg swelling Denies chest pain or edema     No Known Allergies  Immunization History  Administered Date(s) Administered   Tdap 09/12/2010    Tobacco History: Social History   Tobacco Use  Smoking Status Never  Smokeless Tobacco Never   Counseling given: Not Answered   Outpatient Encounter Medications as of 04/26/2024  Medication Sig   acetaminophen  (TYLENOL ) 325 MG tablet Take 650 mg by mouth every 6 (six) hours as needed for mild pain.   albuterol  (VENTOLIN  HFA) 108 (90 Base) MCG/ACT inhaler Inhale 2 puffs into the lungs every 4 (four) hours as needed for wheezing or shortness of breath (coughing fits).   furosemide  (LASIX ) 40 MG tablet Take 1 tablet (40 mg total) by mouth daily as needed for edema (3 pound weight gain or shortness of breath).   ibuprofen  (ADVIL ) 600 MG tablet Take 1 tablet (600 mg total) by mouth every 6 (six) hours as needed for cramping or moderate pain (pain score 4-6).   NIFEdipine  (PROCARDIA -XL/NIFEDICAL-XL) 30 MG 24 hr tablet Take 1 tablet (30 mg total) by mouth daily.   Prenatal Vit-Fe Fumarate-FA (PREPLUS) 27-1 MG TABS Take 1 tablet by mouth daily.   No  facility-administered encounter medications on file as of 04/26/2024.    Review of Systems  Review of Systems  Constitutional: Negative.   HENT: Negative.    Cardiovascular: Negative.   Gastrointestinal: Negative.   Allergic/Immunologic: Negative.   Neurological: Negative.   Psychiatric/Behavioral: Negative.       Objective:   BP 111/68   Pulse 84   Temp 98.1 F (36.7 C) (Oral)   Wt 165 lb (74.8 kg)   SpO2 100%   BMI 31.18 kg/m   Wt Readings from Last 5 Encounters:  04/26/24 165 lb (74.8 kg)  03/11/24 159 lb 3.2 oz (72.2 kg)  02/29/24 162 lb 3.2 oz (73.6 kg)  02/21/24 138 lb 12.8 oz (63 kg)  04/25/22 174 lb 3.2 oz (79 kg)     Physical Exam Vitals and nursing note reviewed.  Constitutional:      General: She is not in acute distress.    Appearance: She is well-developed.  Cardiovascular:     Rate and Rhythm: Normal rate and regular rhythm.  Pulmonary:     Effort: Pulmonary effort is normal.     Breath sounds: Normal breath sounds.  Neurological:     Mental Status: She is alert and oriented to person, place, and time.       Assessment & Plan:   Anemia, unspecified type -     CBC -     Comprehensive metabolic panel with GFR  Return in about 6 months (around 10/27/2024).   Jerrlyn Morel, NP 04/26/2024

## 2024-04-27 LAB — COMPREHENSIVE METABOLIC PANEL WITH GFR
ALT: 15 IU/L (ref 0–32)
AST: 17 IU/L (ref 0–40)
Albumin: 4.5 g/dL (ref 4.0–5.0)
Alkaline Phosphatase: 116 IU/L (ref 44–121)
BUN/Creatinine Ratio: 27 — ABNORMAL HIGH (ref 9–23)
BUN: 12 mg/dL (ref 6–20)
Bilirubin Total: 0.9 mg/dL (ref 0.0–1.2)
CO2: 20 mmol/L (ref 20–29)
Calcium: 9.4 mg/dL (ref 8.7–10.2)
Chloride: 103 mmol/L (ref 96–106)
Creatinine, Ser: 0.44 mg/dL — ABNORMAL LOW (ref 0.57–1.00)
Globulin, Total: 3.1 g/dL (ref 1.5–4.5)
Glucose: 136 mg/dL — ABNORMAL HIGH (ref 70–99)
Potassium: 4.4 mmol/L (ref 3.5–5.2)
Sodium: 139 mmol/L (ref 134–144)
Total Protein: 7.6 g/dL (ref 6.0–8.5)
eGFR: 138 mL/min/{1.73_m2} (ref >59)

## 2024-04-27 LAB — CBC
Hematocrit: 35.3 % (ref 34.0–46.6)
Hemoglobin: 11.2 g/dL (ref 11.1–15.9)
MCH: 24.5 pg — ABNORMAL LOW (ref 26.6–33.0)
MCHC: 31.7 g/dL (ref 31.5–35.7)
MCV: 77 fL — ABNORMAL LOW (ref 79–97)
Platelets: 375 10*3/uL (ref 150–450)
RBC: 4.57 x10E6/uL (ref 3.77–5.28)
RDW: 15.5 % — ABNORMAL HIGH (ref 11.7–15.4)
WBC: 5.7 10*3/uL (ref 3.4–10.8)

## 2024-05-04 ENCOUNTER — Ambulatory Visit: Payer: Self-pay

## 2024-10-27 ENCOUNTER — Ambulatory Visit: Payer: Self-pay | Admitting: Nurse Practitioner

## 2024-11-03 ENCOUNTER — Ambulatory Visit: Payer: Self-pay | Admitting: Urgent Care

## 2024-11-03 ENCOUNTER — Other Ambulatory Visit: Payer: Self-pay

## 2024-11-03 ENCOUNTER — Ambulatory Visit
Admission: EM | Admit: 2024-11-03 | Discharge: 2024-11-03 | Disposition: A | Discharge status: Home / Self Care | Attending: Family Medicine | Admitting: Family Medicine

## 2024-11-03 ENCOUNTER — Ambulatory Visit (INDEPENDENT_AMBULATORY_CARE_PROVIDER_SITE_OTHER)

## 2024-11-03 DIAGNOSIS — M25572 Pain in left ankle and joints of left foot: Secondary | ICD-10-CM | POA: Diagnosis not present

## 2024-11-03 DIAGNOSIS — S93402A Sprain of unspecified ligament of left ankle, initial encounter: Secondary | ICD-10-CM | POA: Diagnosis not present

## 2024-11-03 HISTORY — DX: Essential (primary) hypertension: I10

## 2024-11-03 MED ORDER — IBUPROFEN 600 MG PO TABS: 600.0000 mg | ORAL_TABLET | Freq: Four times a day (QID) | ORAL | 0 refills | Status: AC | PRN

## 2024-11-03 NOTE — ED Provider Notes (Signed)
 [facility]  Note:  This document was prepared using Conservation officer, historic buildings and may include unintentional dictation errors.  MRN: [id number] DOB: [birth year]  Subjective:   Peggy [name] is a 23 y.o. female presenting for acute onset of moderate to severe left ankle pain with swelling after suffering a fall today.  Missed a step and landed awkwardly on her leg rolling her ankle laterally.  Has had difficulty bearing weight.  No concern for pregnancy per patient.   No current facility-administered medications for this encounter.  Current Outpatient Medications:    acetaminophen  (TYLENOL ) 325 MG tablet, Take 650 mg by mouth every 6 (six) hours as needed for mild pain., Disp: , Rfl:    albuterol  (VENTOLIN  HFA) 108 (90 Base) MCG/ACT inhaler, Inhale 2 puffs into the lungs every 4 (four) hours as needed for wheezing or shortness of breath (coughing fits)., Disp: 18 g, Rfl: 1   furosemide  (LASIX ) 40 MG tablet, Take 1 tablet (40 mg total) by mouth daily as needed for edema (3 pound weight gain or shortness of breath)., Disp: 30 tablet, Rfl: 0   ibuprofen  (ADVIL ) 600 MG tablet, Take 1 tablet (600 mg total) by mouth every 6 (six) hours as needed for cramping or moderate pain (pain score 4-6)., Disp: 40 tablet, Rfl: 1   NIFEdipine  (PROCARDIA -XL/NIFEDICAL-XL) 30 MG 24 hr tablet, Take 1 tablet (30 mg total) by mouth daily., Disp: 30 tablet, Rfl: 0   Prenatal Vit-Fe Fumarate-FA (PREPLUS) 27-1 MG TABS, Take 1 tablet by mouth daily., Disp: , Rfl:    No Known Allergies  Past Medical History:  Diagnosis Date   Asthma      Past Surgical History:  Procedure Laterality Date   NO PAST SURGERIES      Family History  Problem Relation Age of Onset   Hypertension Mother    Hypertension Maternal Grandmother    Allergic rhinitis Neg Hx    Angioedema Neg Hx    Asthma Neg Hx    Eczema Neg Hx    Immunodeficiency Neg Hx    Urticaria Neg Hx     Social  History   Tobacco Use   Smoking status: Never   Smokeless tobacco: Never  Vaping Use   Vaping status: Never Used  Substance Use Topics   Alcohol use: Not Currently    Alcohol/week: 0.0 standard drinks of alcohol   Drug use: No    ROS   Objective:   Vitals: BP 101/66 (BP Location: Left Arm)   Pulse 99   Temp 98 F (36.7 C) (Oral)   Resp 18   Ht 5' (1.524 m)   Wt 170 lb (77.1 kg)   LMP 10/29/2024   SpO2 97%   BMI 33.20 kg/m   Physical Exam Constitutional:      General: She is not in acute distress.    Appearance: Normal appearance. She is well-developed. She is not ill-appearing, toxic-appearing or diaphoretic.  HENT:     Head: Normocephalic and atraumatic.     Nose: Nose normal.     Mouth/Throat:     Mouth: Mucous membranes are moist.  Eyes:     General: No scleral icterus.       Right eye: No discharge.        Left eye: No discharge.     Extraocular Movements: Extraocular movements intact.  Cardiovascular:     Rate and Rhythm: Normal rate.  Pulmonary:     Effort: Pulmonary effort is normal.  Musculoskeletal:     Left ankle: Swelling (trace-minimal) present. No deformity, ecchymosis or lacerations. Tenderness present over the lateral malleolus, ATF ligament and AITF ligament. No medial malleolus, CF ligament, posterior TF ligament, base of 5th metatarsal or proximal fibula tenderness. Decreased range of motion.     Left Achilles Tendon: No tenderness or defects. Thompson's test negative.  Skin:    General: Skin is warm and dry.  Neurological:     General: No focal deficit present.     Mental Status: She is alert and oriented to person, place, and time.  Psychiatric:        Mood and Affect: Mood normal.        Behavior: Behavior normal.    Left ankle wrapped using 3 Ace wrap in figure-8 method. Provided with crutches.  No obvious fracture noted. Radiology over-read is pending.   Assessment and Plan :   PDMP not reviewed this encounter.  1. Acute  left ankle pain   2. Sprain of left ankle, unspecified ligament, initial encounter    Will manage for ankle sprain with rice method, NSAID. Use crutches to ambulate as needed. Counseled patient on potential for adverse effects with medications prescribed/recommended today, ER and return-to-clinic precautions discussed, patient verbalized understanding.    Christopher Savannah, [place] 11/03/24 [phone]

## 2024-11-03 NOTE — ED Triage Notes (Signed)
 Pt stated that while walking down  the steps she missed a step and fell landing  on her left leg. Pt stated that she is unable to move the left ankle. Pt c/o pain to lt ankle 6/10. No medication used for pain.

## 2024-12-19 ENCOUNTER — Emergency Department (HOSPITAL_COMMUNITY)
Admission: EM | Admit: 2024-12-19 | Discharge: 2024-12-19 | Discharge status: Against Medical Advice | Source: Ambulatory Visit | Attending: Emergency Medicine | Admitting: Emergency Medicine

## 2024-12-19 ENCOUNTER — Emergency Department (HOSPITAL_COMMUNITY)

## 2024-12-19 ENCOUNTER — Encounter (HOSPITAL_COMMUNITY): Payer: Self-pay

## 2024-12-19 ENCOUNTER — Ambulatory Visit (HOSPITAL_COMMUNITY)
Admission: EM | Admit: 2024-12-19 | Discharge: 2024-12-19 | Disposition: A | Discharge status: Home / Self Care | Attending: Emergency Medicine | Admitting: Emergency Medicine

## 2024-12-19 ENCOUNTER — Other Ambulatory Visit: Payer: Self-pay

## 2024-12-19 ENCOUNTER — Ambulatory Visit (INDEPENDENT_AMBULATORY_CARE_PROVIDER_SITE_OTHER)

## 2024-12-19 DIAGNOSIS — Z5321 Procedure and treatment not carried out due to patient leaving prior to being seen by health care provider: Secondary | ICD-10-CM | POA: Insufficient documentation

## 2024-12-19 DIAGNOSIS — R079 Chest pain, unspecified: Secondary | ICD-10-CM

## 2024-12-19 DIAGNOSIS — R519 Headache, unspecified: Secondary | ICD-10-CM | POA: Diagnosis not present

## 2024-12-19 DIAGNOSIS — H538 Other visual disturbances: Secondary | ICD-10-CM | POA: Diagnosis not present

## 2024-12-19 DIAGNOSIS — R112 Nausea with vomiting, unspecified: Secondary | ICD-10-CM | POA: Insufficient documentation

## 2024-12-19 LAB — COMPREHENSIVE METABOLIC PANEL WITH GFR
ALT: 14 U/L (ref 0–44)
AST: 22 U/L (ref 15–41)
Albumin: 4.5 g/dL (ref 3.5–5.0)
Alkaline Phosphatase: 95 U/L (ref 38–126)
Anion gap: 10 (ref 5–15)
BUN: 16 mg/dL (ref 6–20)
CO2: 24 mmol/L (ref 22–32)
Calcium: 9.2 mg/dL (ref 8.9–10.3)
Chloride: 103 mmol/L (ref 98–111)
Creatinine, Ser: 0.49 mg/dL (ref 0.44–1.00)
GFR, Estimated: >60 mL/min
Glucose, Bld: 91 mg/dL (ref 70–99)
Potassium: 4.4 mmol/L (ref 3.5–5.1)
Sodium: 136 mmol/L (ref 135–145)
Total Bilirubin: 1.4 mg/dL — ABNORMAL HIGH (ref 0.0–1.2)
Total Protein: 8 g/dL (ref 6.5–8.1)

## 2024-12-19 LAB — CBC
HCT: 40 % (ref 36.0–46.0)
Hemoglobin: 12.7 g/dL (ref 12.0–15.0)
MCH: 26 pg (ref 26.0–34.0)
MCHC: 31.8 g/dL (ref 30.0–36.0)
MCV: 82 fL (ref 80.0–100.0)
Platelets: 283 K/uL (ref 150–400)
RBC: 4.88 MIL/uL (ref 3.87–5.11)
RDW: 14.6 % (ref 11.5–15.5)
WBC: 7.5 K/uL (ref 4.0–10.5)
nRBC: 0 % (ref 0.0–0.2)

## 2024-12-19 LAB — TROPONIN T, HIGH SENSITIVITY
Troponin T High Sensitivity: <15 ng/L (ref 0–19)
Troponin T High Sensitivity: <15 ng/L (ref 0–19)

## 2024-12-19 LAB — HCG, SERUM, QUALITATIVE: Preg, Serum: NEGATIVE

## 2024-12-19 LAB — POCT INFLUENZA A/B
Influenza A, POC: NEGATIVE
Influenza B, POC: NEGATIVE

## 2024-12-19 NOTE — ED Triage Notes (Signed)
 Pt c/o Ha, slurred speech w/Hax1wk. Pt c/o black specks that turn gray and then half of her vision goes black and alternates between both eyesx3d. Pt c/o N/V started today. Pt states vomited twice today.

## 2024-12-19 NOTE — ED Triage Notes (Signed)
 Pt reports headache x 1 week, pulsating and pain wrapped around whole head, not able to think straight. Pt states that she was at work and almost fainted. Patient saw black spots,  Nausea and vomiting this morning. Family hx of brain aneurysm. Chest discomfort, feels like someone is sitting on her chest. Numbness, tingling, and weakness. 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 Excedrin migraine is the only thing that has helped minimally. Pt has used cold packs. No medications this morning. Strep, COVID, flu, and pregnancy test all negative today.

## 2024-12-19 NOTE — ED Provider Triage Note (Signed)
 Emergency Medicine Provider Triage Evaluation Note  Peggy [name] , a 23 y.o. female  was evaluated in triage.  Pt complains of headaches, blurry vision, no focal deficit, no trauma, no fever. Also started with CP today. Gone now.   Review of Systems  Positive: HA, CP Negative: Fever  Physical Exam  BP 111/69 (BP Location: Right Arm)   Pulse 98   Temp 97.9 F (36.6 C) (Oral)   Resp 20   LMP 10/29/2024 (Exact Date)   SpO2 99%  Gen:   Awake, no distress   Resp:  Normal effort  MSK:   Moves extremities without difficulty  Other:    Medical Decision Making  Medically screening exam initiated at 11:36 AM.  Appropriate orders placed.  Peggy [name] was informed that the remainder of the evaluation will be completed by another provider, this initial triage assessment does not replace that evaluation, and the importance of remaining in the ED until their evaluation is complete.     Shermon Warren SAILOR, PA-C 12/19/24 1138
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
## 2024-12-19 NOTE — Discharge Instructions (Signed)
 I am concerned over the recent vision changes with your bad headaches.  Head to the nearest emergency department for further evaluation of your symptoms.
